# Patient Record
Sex: Male | Born: 1960
Health system: Southern US, Community
[De-identification: ages and names within clinical notes are randomized; demographics above are authoritative.]

## PROBLEM LIST (undated history)

## (undated) DIAGNOSIS — S82209A Unspecified fracture of shaft of unspecified tibia, initial encounter for closed fracture: Secondary | ICD-10-CM

## (undated) DIAGNOSIS — S82409A Unspecified fracture of shaft of unspecified fibula, initial encounter for closed fracture: Secondary | ICD-10-CM

## (undated) HISTORY — PX: TONSILLECTOMY: SUR1361

## (undated) HISTORY — PX: MULTIPLE TOOTH EXTRACTIONS: SHX2053

## (undated) HISTORY — PX: KIDNEY SURGERY: SHX687

## (undated) HISTORY — PX: COLONOSCOPY W/ BIOPSIES AND POLYPECTOMY: SHX1376

---

## 2014-08-01 ENCOUNTER — Inpatient Hospital Stay (HOSPITAL_COMMUNITY): Payer: 59

## 2014-08-01 ENCOUNTER — Emergency Department (HOSPITAL_COMMUNITY): Payer: 59

## 2014-08-01 ENCOUNTER — Encounter (HOSPITAL_COMMUNITY): Payer: Self-pay | Admitting: Emergency Medicine

## 2014-08-01 ENCOUNTER — Inpatient Hospital Stay (HOSPITAL_COMMUNITY)
Admission: EM | Admit: 2014-08-01 | Discharge: 2014-08-05 | DRG: 494 | Disposition: A | Discharge status: Home / Self Care | Payer: 59 | Attending: Orthopaedic Surgery | Admitting: Orthopaedic Surgery

## 2014-08-01 DIAGNOSIS — T1490XA Injury, unspecified, initial encounter: Secondary | ICD-10-CM

## 2014-08-01 DIAGNOSIS — M25772 Osteophyte, left ankle: Secondary | ICD-10-CM | POA: Diagnosis present

## 2014-08-01 DIAGNOSIS — Z8781 Personal history of (healed) traumatic fracture: Secondary | ICD-10-CM | POA: Diagnosis not present

## 2014-08-01 DIAGNOSIS — Z09 Encounter for follow-up examination after completed treatment for conditions other than malignant neoplasm: Secondary | ICD-10-CM

## 2014-08-01 DIAGNOSIS — S82252A Displaced comminuted fracture of shaft of left tibia, initial encounter for closed fracture: Principal | ICD-10-CM | POA: Diagnosis present

## 2014-08-01 DIAGNOSIS — Z87891 Personal history of nicotine dependence: Secondary | ICD-10-CM | POA: Diagnosis not present

## 2014-08-01 DIAGNOSIS — S82452A Displaced comminuted fracture of shaft of left fibula, initial encounter for closed fracture: Secondary | ICD-10-CM | POA: Diagnosis present

## 2014-08-01 DIAGNOSIS — S82202A Unspecified fracture of shaft of left tibia, initial encounter for closed fracture: Secondary | ICD-10-CM

## 2014-08-01 DIAGNOSIS — S82209A Unspecified fracture of shaft of unspecified tibia, initial encounter for closed fracture: Secondary | ICD-10-CM | POA: Diagnosis present

## 2014-08-01 DIAGNOSIS — S82402A Unspecified fracture of shaft of left fibula, initial encounter for closed fracture: Secondary | ICD-10-CM

## 2014-08-01 DIAGNOSIS — M79662 Pain in left lower leg: Secondary | ICD-10-CM | POA: Diagnosis present

## 2014-08-01 DIAGNOSIS — Z886 Allergy status to analgesic agent status: Secondary | ICD-10-CM

## 2014-08-01 DIAGNOSIS — T148XXA Other injury of unspecified body region, initial encounter: Secondary | ICD-10-CM

## 2014-08-01 DIAGNOSIS — S82409A Unspecified fracture of shaft of unspecified fibula, initial encounter for closed fracture: Secondary | ICD-10-CM

## 2014-08-01 MED ORDER — SODIUM CHLORIDE 0.9 % IV SOLN
INTRAVENOUS | Status: AC
  Administered 2014-08-01 – 2014-08-03 (×2): via INTRAVENOUS

## 2014-08-01 MED ORDER — HYDROMORPHONE HCL 1 MG/ML IJ SOLN
0.5000 mg | INTRAMUSCULAR | Status: DC | PRN
  Administered 2014-08-01 – 2014-08-02 (×3): 1 mg via INTRAVENOUS
  Filled 2014-08-01 (×3): qty 1

## 2014-08-01 MED ORDER — FENTANYL CITRATE (PF) 100 MCG/2ML IJ SOLN
INTRAMUSCULAR | Status: AC
  Filled 2014-08-01: qty 2

## 2014-08-01 MED ORDER — ONDANSETRON HCL 4 MG/2ML IJ SOLN: 4.0000 mg | Freq: Three times a day (TID) | INTRAMUSCULAR | Status: AC | PRN

## 2014-08-01 MED ORDER — OXYCODONE-ACETAMINOPHEN 5-325 MG PO TABS
1.0000 | ORAL_TABLET | Freq: Four times a day (QID) | ORAL | Status: DC | PRN
  Administered 2014-08-01: 1 via ORAL
  Filled 2014-08-01: qty 1

## 2014-08-01 MED ORDER — HYDROMORPHONE HCL 1 MG/ML IJ SOLN
1.0000 mg | Freq: Once | INTRAMUSCULAR | Status: AC
  Administered 2014-08-01: 1 mg via INTRAVENOUS
  Filled 2014-08-01: qty 1

## 2014-08-01 MED ORDER — OXYCODONE-ACETAMINOPHEN 5-325 MG PO TABS
1.0000 | ORAL_TABLET | ORAL | Status: DC | PRN
  Administered 2014-08-02 – 2014-08-05 (×10): 2 via ORAL
  Filled 2014-08-01 (×10): qty 2

## 2014-08-01 MED ORDER — FENTANYL CITRATE (PF) 100 MCG/2ML IJ SOLN
100.0000 ug | Freq: Once | INTRAMUSCULAR | Status: AC
  Administered 2014-08-01: 100 ug via INTRAVENOUS

## 2014-08-01 NOTE — ED Notes (Signed)
Report attempted 

## 2014-08-01 NOTE — Progress Notes (Signed)
Patient ID: Corey SahaJeffrey Laidler, male   DOB: [birth year], 54 y.o.   MRN: [id number] Patient with left closed tib fib fracture reduced by dr. Wandra Feinstein. Murphy in ER with family I have cared for in the past .   Will need tibial nail. Compartments soft NVI.  Tibial nail tomorrow at 12 noon. Portable CXR recommended PA and lat due to mediastinum being slightly wide and will get post reduction films on tib fib.   Discussed with patient procedure. Surgery tomorrow at noon. Risks discussed he agrees to proceed.
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
## 2014-08-01 NOTE — ED Notes (Signed)
Pts O2 dropped to 88% on RA. 2L [place] applied. O2 now 100%.

## 2014-08-01 NOTE — ED Notes (Signed)
Pt on the way to [facility] now.
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
## 2014-08-02 ENCOUNTER — Inpatient Hospital Stay (HOSPITAL_COMMUNITY): Payer: 59 | Admitting: Anesthesiology

## 2014-08-02 ENCOUNTER — Encounter (HOSPITAL_COMMUNITY)
Admission: EM | Disposition: A | Discharge status: Home / Self Care | Payer: Self-pay | Source: Home / Self Care | Attending: Orthopaedic Surgery

## 2014-08-02 ENCOUNTER — Inpatient Hospital Stay (HOSPITAL_COMMUNITY): Payer: 59

## 2014-08-02 ENCOUNTER — Encounter (HOSPITAL_COMMUNITY): Payer: Self-pay | Admitting: Anesthesiology

## 2014-08-02 HISTORY — PX: EXTERNAL FIXATION LEG: SHX1549

## 2014-08-02 HISTORY — PX: ORIF FIBULA FRACTURE: SHX5114

## 2014-08-02 LAB — CBC
HCT: 40.2 % (ref 39.0–52.0)
Hemoglobin: 13.6 g/dL (ref 13.0–17.0)
MCH: 30.3 pg (ref 26.0–34.0)
MCHC: 33.8 g/dL (ref 30.0–36.0)
MCV: 89.5 fL (ref 78.0–100.0)
Platelets: 223 10*3/uL (ref 150–400)
RBC: 4.49 MIL/uL (ref 4.22–5.81)
RDW: 13 % (ref 11.5–15.5)
WBC: 10.7 10*3/uL — ABNORMAL HIGH (ref 4.0–10.5)

## 2014-08-02 LAB — CREATININE, SERUM
Creatinine, Ser: 0.9 mg/dL (ref 0.61–1.24)
GFR calc Af Amer: >60 mL/min (ref >60)
GFR calc non Af Amer: >60 mL/min (ref >60)

## 2014-08-02 LAB — SURGICAL PCR SCREEN
MRSA, PCR: NEGATIVE
Staphylococcus aureus: NEGATIVE

## 2014-08-02 SURGERY — OPEN REDUCTION INTERNAL FIXATION (ORIF) FIBULA FRACTURE
Anesthesia: General | Site: Leg Lower | Laterality: Left

## 2014-08-02 MED ORDER — CEFAZOLIN SODIUM-DEXTROSE 2-3 GM-% IV SOLR
INTRAVENOUS | Status: AC
  Filled 2014-08-02: qty 50

## 2014-08-02 MED ORDER — MIDAZOLAM HCL 5 MG/5ML IJ SOLN
INTRAMUSCULAR | Status: DC | PRN
  Administered 2014-08-02: 2 mg via INTRAVENOUS

## 2014-08-02 MED ORDER — DEXAMETHASONE SODIUM PHOSPHATE 4 MG/ML IJ SOLN
INTRAMUSCULAR | Status: DC | PRN
  Administered 2014-08-02: 8 mg via INTRAVENOUS

## 2014-08-02 MED ORDER — MEPERIDINE HCL 25 MG/ML IJ SOLN: 6.2500 mg | INTRAMUSCULAR | Status: DC | PRN

## 2014-08-02 MED ORDER — BISACODYL 10 MG RE SUPP: 10.0000 mg | Freq: Every day | RECTAL | Status: DC | PRN

## 2014-08-02 MED ORDER — NEOSTIGMINE METHYLSULFATE 10 MG/10ML IV SOLN
INTRAVENOUS | Status: DC | PRN
  Administered 2014-08-02: 5 mg via INTRAVENOUS

## 2014-08-02 MED ORDER — BUPIVACAINE-EPINEPHRINE (PF) 0.5% -1:200000 IJ SOLN
INTRAMUSCULAR | Status: AC
  Filled 2014-08-02: qty 30

## 2014-08-02 MED ORDER — LIDOCAINE HCL (CARDIAC) 20 MG/ML IV SOLN
INTRAVENOUS | Status: DC | PRN
  Administered 2014-08-02: 80 mg via INTRAVENOUS

## 2014-08-02 MED ORDER — FLEET ENEMA 7-19 GM/118ML RE ENEM: 1.0000 | ENEMA | Freq: Once | RECTAL | Status: AC | PRN

## 2014-08-02 MED ORDER — CEFAZOLIN SODIUM-DEXTROSE 2-3 GM-% IV SOLR
INTRAVENOUS | Status: DC | PRN
  Administered 2014-08-02: 2 g via INTRAVENOUS

## 2014-08-02 MED ORDER — ACETAMINOPHEN 650 MG RE SUPP: 650.0000 mg | Freq: Four times a day (QID) | RECTAL | Status: DC | PRN

## 2014-08-02 MED ORDER — ONDANSETRON HCL 4 MG/2ML IJ SOLN
INTRAMUSCULAR | Status: DC | PRN
  Administered 2014-08-02: 4 mg via INTRAVENOUS

## 2014-08-02 MED ORDER — ONDANSETRON HCL 4 MG PO TABS: 4.0000 mg | ORAL_TABLET | Freq: Four times a day (QID) | ORAL | Status: DC | PRN

## 2014-08-02 MED ORDER — GLYCOPYRROLATE 0.2 MG/ML IJ SOLN
INTRAMUSCULAR | Status: AC
  Filled 2014-08-02: qty 3

## 2014-08-02 MED ORDER — LACTATED RINGERS IV SOLN
INTRAVENOUS | Status: DC | PRN
  Administered 2014-08-02 (×2): via INTRAVENOUS

## 2014-08-02 MED ORDER — ROCURONIUM BROMIDE 100 MG/10ML IV SOLN
INTRAVENOUS | Status: DC | PRN
  Administered 2014-08-02: 50 mg via INTRAVENOUS

## 2014-08-02 MED ORDER — MIDAZOLAM HCL 2 MG/2ML IJ SOLN
INTRAMUSCULAR | Status: AC
  Filled 2014-08-02: qty 2

## 2014-08-02 MED ORDER — ENOXAPARIN SODIUM 40 MG/0.4ML ~~LOC~~ SOLN
40.0000 mg | SUBCUTANEOUS | Status: DC
  Administered 2014-08-03 – 2014-08-05 (×3): 40 mg via SUBCUTANEOUS
  Filled 2014-08-02 (×3): qty 0.4

## 2014-08-02 MED ORDER — ARTIFICIAL TEARS OP OINT
TOPICAL_OINTMENT | OPHTHALMIC | Status: DC | PRN
  Administered 2014-08-02: 1 via OPHTHALMIC

## 2014-08-02 MED ORDER — FENTANYL CITRATE (PF) 100 MCG/2ML IJ SOLN
INTRAMUSCULAR | Status: DC | PRN
Start: 2014-08-02 — End: 2014-08-02
  Administered 2014-08-02: 50 ug via INTRAVENOUS
  Administered 2014-08-02: 75 ug via INTRAVENOUS
  Administered 2014-08-02: 100 ug via INTRAVENOUS
  Administered 2014-08-02 (×3): 50 ug via INTRAVENOUS
  Administered 2014-08-02: 25 ug via INTRAVENOUS
  Administered 2014-08-02 (×2): 50 ug via INTRAVENOUS

## 2014-08-02 MED ORDER — OXYCODONE HCL 5 MG/5ML PO SOLN
5.0000 mg | Freq: Once | ORAL | Status: DC | PRN
Start: 2014-08-02 — End: 2014-08-02

## 2014-08-02 MED ORDER — POLYETHYLENE GLYCOL 3350 17 G PO PACK
17.0000 g | PACK | Freq: Every day | ORAL | Status: DC | PRN
  Administered 2014-08-05: 17 g via ORAL

## 2014-08-02 MED ORDER — NEOSTIGMINE METHYLSULFATE 10 MG/10ML IV SOLN
INTRAVENOUS | Status: AC
  Filled 2014-08-02: qty 1

## 2014-08-02 MED ORDER — PROPOFOL 10 MG/ML IV BOLUS
INTRAVENOUS | Status: DC | PRN
  Administered 2014-08-02: 300 mg via INTRAVENOUS

## 2014-08-02 MED ORDER — CEFAZOLIN SODIUM-DEXTROSE 2-3 GM-% IV SOLR
2.0000 g | Freq: Four times a day (QID) | INTRAVENOUS | Status: AC
Start: 2014-08-02 — End: 2014-08-03
  Administered 2014-08-02 – 2014-08-03 (×3): 2 g via INTRAVENOUS
  Filled 2014-08-02 (×3): qty 50

## 2014-08-02 MED ORDER — HYDROCODONE-ACETAMINOPHEN 10-325 MG PO TABS
1.0000 | ORAL_TABLET | ORAL | Status: DC | PRN
  Administered 2014-08-02: 1 via ORAL
  Administered 2014-08-02 – 2014-08-04 (×8): 2 via ORAL
  Filled 2014-08-02 (×5): qty 2
  Filled 2014-08-02: qty 1
  Filled 2014-08-02 (×3): qty 2

## 2014-08-02 MED ORDER — FENTANYL CITRATE (PF) 250 MCG/5ML IJ SOLN
INTRAMUSCULAR | Status: AC
  Filled 2014-08-02: qty 5

## 2014-08-02 MED ORDER — METHOCARBAMOL 500 MG PO TABS
500.0000 mg | ORAL_TABLET | Freq: Four times a day (QID) | ORAL | Status: DC | PRN
  Administered 2014-08-02 – 2014-08-05 (×7): 500 mg via ORAL
  Filled 2014-08-02 (×8): qty 1

## 2014-08-02 MED ORDER — ONDANSETRON HCL 4 MG/2ML IJ SOLN: 4.0000 mg | Freq: Four times a day (QID) | INTRAMUSCULAR | Status: DC | PRN

## 2014-08-02 MED ORDER — METHOCARBAMOL 1000 MG/10ML IJ SOLN
500.0000 mg | Freq: Four times a day (QID) | INTRAVENOUS | Status: DC | PRN
  Filled 2014-08-02: qty 5

## 2014-08-02 MED ORDER — KETOROLAC TROMETHAMINE 15 MG/ML IJ SOLN
15.0000 mg | Freq: Four times a day (QID) | INTRAMUSCULAR | Status: AC
  Administered 2014-08-02 – 2014-08-03 (×4): 15 mg via INTRAVENOUS
  Filled 2014-08-02 (×4): qty 1

## 2014-08-02 MED ORDER — DOCUSATE SODIUM 100 MG PO CAPS
100.0000 mg | ORAL_CAPSULE | Freq: Two times a day (BID) | ORAL | Status: DC
  Administered 2014-08-03 – 2014-08-05 (×5): 100 mg via ORAL
  Filled 2014-08-02 (×6): qty 1

## 2014-08-02 MED ORDER — METOCLOPRAMIDE HCL 5 MG/ML IJ SOLN: 5.0000 mg | Freq: Three times a day (TID) | INTRAMUSCULAR | Status: DC | PRN

## 2014-08-02 MED ORDER — BUPIVACAINE-EPINEPHRINE 0.5% -1:200000 IJ SOLN
INTRAMUSCULAR | Status: DC | PRN
  Administered 2014-08-02: 10 mL

## 2014-08-02 MED ORDER — VECURONIUM BROMIDE 10 MG IV SOLR
INTRAVENOUS | Status: DC | PRN
  Administered 2014-08-02: 2 mg via INTRAVENOUS

## 2014-08-02 MED ORDER — OXYCODONE HCL 5 MG PO TABS: 5.0000 mg | ORAL_TABLET | Freq: Once | ORAL | Status: DC | PRN

## 2014-08-02 MED ORDER — GLYCOPYRROLATE 0.2 MG/ML IJ SOLN
INTRAMUSCULAR | Status: DC | PRN
  Administered 2014-08-02: 0.6 mg via INTRAVENOUS

## 2014-08-02 MED ORDER — METOCLOPRAMIDE HCL 5 MG PO TABS: 5.0000 mg | ORAL_TABLET | Freq: Three times a day (TID) | ORAL | Status: DC | PRN

## 2014-08-02 MED ORDER — DEXAMETHASONE SODIUM PHOSPHATE 4 MG/ML IJ SOLN
INTRAMUSCULAR | Status: AC
  Filled 2014-08-02: qty 2

## 2014-08-02 MED ORDER — HYDROMORPHONE HCL 1 MG/ML IJ SOLN
1.0000 mg | INTRAMUSCULAR | Status: DC | PRN
  Administered 2014-08-02 – 2014-08-04 (×9): 1 mg via INTRAVENOUS
  Filled 2014-08-02 (×9): qty 1

## 2014-08-02 MED ORDER — STERILE WATER FOR INJECTION IJ SOLN
INTRAMUSCULAR | Status: AC
  Filled 2014-08-02: qty 10

## 2014-08-02 MED ORDER — ACETAMINOPHEN 325 MG PO TABS: 650.0000 mg | ORAL_TABLET | Freq: Four times a day (QID) | ORAL | Status: DC | PRN

## 2014-08-02 MED ORDER — HYDROMORPHONE HCL 1 MG/ML IJ SOLN: 0.2500 mg | INTRAMUSCULAR | Status: DC | PRN

## 2014-08-02 SURGICAL SUPPLY — 63 items
BANDAGE ELASTIC 4 VELCRO ST LF (GAUZE/BANDAGES/DRESSINGS) ×4 IMPLANT
BANDAGE ELASTIC 6 VELCRO ST LF (GAUZE/BANDAGES/DRESSINGS) ×4 IMPLANT
BANDAGE ESMARK 6X9 LF (GAUZE/BANDAGES/DRESSINGS) IMPLANT
BAR GLASS FIBER EXFX 11X350 (EXFIX) ×8 IMPLANT
BIT DRILL 2.5X2.75 QC CALB (BIT) ×4 IMPLANT
BIT DRILL 2.9 CANN QC NONSTRL (BIT) ×4 IMPLANT
BLADE SURG 15 STRL LF DISP TIS (BLADE) ×2 IMPLANT
BLADE SURG 15 STRL SS (BLADE) ×2
BLADE SURG ROTATE 9660 (MISCELLANEOUS) IMPLANT
BNDG COHESIVE 6X5 TAN STRL LF (GAUZE/BANDAGES/DRESSINGS) ×4 IMPLANT
BNDG ESMARK 6X9 LF (GAUZE/BANDAGES/DRESSINGS)
BNDG GAUZE ELAST 4 BULKY (GAUZE/BANDAGES/DRESSINGS) ×4 IMPLANT
CLAMP BLUE BAR TO PIN (MISCELLANEOUS) ×8 IMPLANT
COVER SURGICAL LIGHT HANDLE (MISCELLANEOUS) ×8 IMPLANT
CUFF TOURNIQUET SINGLE 34IN LL (TOURNIQUET CUFF) IMPLANT
CUFF TOURNIQUET SINGLE 44IN (TOURNIQUET CUFF) IMPLANT
DRAPE C-ARM 42X72 X-RAY (DRAPES) ×4 IMPLANT
DRAPE IMP U-DRAPE 54X76 (DRAPES) ×4 IMPLANT
DRAPE ORTHO SPLIT 77X108 STRL (DRAPES) ×4
DRAPE PROXIMA HALF (DRAPES) ×8 IMPLANT
DRAPE SURG ORHT 6 SPLT 77X108 (DRAPES) ×4 IMPLANT
DRAPE U-SHAPE 47X51 STRL (DRAPES) ×4 IMPLANT
DRSG PAD ABDOMINAL 8X10 ST (GAUZE/BANDAGES/DRESSINGS) ×4 IMPLANT
DURAPREP 26ML APPLICATOR (WOUND CARE) ×4 IMPLANT
ELECT REM PT RETURN 9FT ADLT (ELECTROSURGICAL) ×4
ELECTRODE REM PT RTRN 9FT ADLT (ELECTROSURGICAL) ×2 IMPLANT
FACESHIELD WRAPAROUND (MASK) IMPLANT
GAUZE SPONGE 4X4 12PLY STRL (GAUZE/BANDAGES/DRESSINGS) ×8 IMPLANT
GAUZE XEROFORM 5X9 LF (GAUZE/BANDAGES/DRESSINGS) ×4 IMPLANT
GLOVE BIOGEL PI IND STRL 8 (GLOVE) ×4 IMPLANT
GLOVE BIOGEL PI INDICATOR 8 (GLOVE) ×4
GLOVE ORTHO TXT STRL SZ7.5 (GLOVE) ×4 IMPLANT
GOWN STRL REUS W/ TWL LRG LVL3 (GOWN DISPOSABLE) ×2 IMPLANT
GOWN STRL REUS W/ TWL XL LVL3 (GOWN DISPOSABLE) ×2 IMPLANT
GOWN STRL REUS W/TWL 2XL LVL3 (GOWN DISPOSABLE) ×4 IMPLANT
GOWN STRL REUS W/TWL LRG LVL3 (GOWN DISPOSABLE) ×2
GOWN STRL REUS W/TWL XL LVL3 (GOWN DISPOSABLE) ×2
K-WIRE ACE 1.6X6 (WIRE) ×4
KIT BASIN OR (CUSTOM PROCEDURE TRAY) ×4 IMPLANT
KIT ROOM TURNOVER OR (KITS) ×4 IMPLANT
KWIRE ACE 1.6X6 (WIRE) ×2 IMPLANT
MANIFOLD NEPTUNE II (INSTRUMENTS) ×4 IMPLANT
NS IRRIG 1000ML POUR BTL (IV SOLUTION) ×4 IMPLANT
PACK GENERAL/GYN (CUSTOM PROCEDURE TRAY) ×4 IMPLANT
PACK UNIVERSAL I (CUSTOM PROCEDURE TRAY) ×4 IMPLANT
PAD ARMBOARD 7.5X6 YLW CONV (MISCELLANEOUS) ×8 IMPLANT
PIN CLAMP 2BAR 75MM BLUE (PIN) ×4 IMPLANT
PIN HALF YELLOW 5X160X35 (PIN) ×8 IMPLANT
PIN TRANSFIXING 5.0 (PIN) ×4 IMPLANT
PLATE ACE 100DEG 7HOLE (Plate) ×4 IMPLANT
SCREW ACE CAN 4.0 55M (Screw) ×4 IMPLANT
SCREW CORT 3.5X16 815037016 (Screw) ×28 IMPLANT
SPONGE GAUZE 4X4 12PLY STER LF (GAUZE/BANDAGES/DRESSINGS) ×4 IMPLANT
STAPLER VISISTAT 35W (STAPLE) ×4 IMPLANT
STOCKINETTE IMPERVIOUS LG (DRAPES) ×4 IMPLANT
SUT VIC AB 0 CT1 27 (SUTURE) ×2
SUT VIC AB 0 CT1 27XBRD ANBCTR (SUTURE) ×2 IMPLANT
SUT VIC AB 2-0 CT1 27 (SUTURE) ×2
SUT VIC AB 2-0 CT1 TAPERPNT 27 (SUTURE) ×2 IMPLANT
TOWEL OR 17X24 6PK STRL BLUE (TOWEL DISPOSABLE) ×4 IMPLANT
TOWEL OR 17X26 10 PK STRL BLUE (TOWEL DISPOSABLE) ×4 IMPLANT
TRAY FOLEY CATH 16FRSI W/METER (SET/KITS/TRAYS/PACK) IMPLANT
WATER STERILE IRR 1000ML POUR (IV SOLUTION) ×4 IMPLANT

## 2014-08-02 NOTE — Brief Op Note (Signed)
08/01/2014 - 08/02/2014  2:19 PM  PATIENT:  Corey SahaJeffrey [name]  54 y.o. male  PRE-OPERATIVE DIAGNOSIS:  left tib/ fib fracture  POST-OPERATIVE DIAGNOSIS:  left tib/ fib fracture  PROCEDURE:  Procedure(s): EXTERNAL FIXATION LEFT TIBIA (Left) OPEN REDUCTION INTERNAL FIXATION (ORIF) FIBULA FRACTURE (Left)  SURGEON:  Surgeon(s) and Role:    * Eldred MangesMark C Kymberley Raz, MD - Primary  PHYSICIAN ASSISTANT:   ASSISTANTS: none   ANESTHESIA:   local and general  EBL:  Total I/O In: 1000 [I.V.:1000] Out: -   BLOOD ADMINISTERED:none  DRAINS: none   LOCAL MEDICATIONS USED:  MARCAINE     SPECIMEN:  No Specimen  DISPOSITION OF SPECIMEN:  N/A  COUNTS:  YES  TOURNIQUET:   Total Tourniquet Time Documented: Thigh (Left) - 33 minutes Total: Thigh (Left) - 33 minutes   DICTATION: .Other Dictation: Dictation Number [id number]  PLAN OF CARE: already inpatient  PATIENT DISPOSITION:  PACU - hemodynamically stable.   Delay start of Pharmacological VTE agent (>24hrs) due to surgical blood loss or risk of bleeding: yes

## 2014-08-02 NOTE — Anesthesia Procedure Notes (Signed)
Procedure Name: Intubation Date/Time: 08/02/2014 12:44 PM Performed by: Wray KearnsFOLEY, Corey Rorabaugh A Pre-anesthesia Checklist: Patient identified, Emergency Drugs available, Suction available, Patient being monitored and Timeout performed Patient Re-evaluated:Patient Re-evaluated prior to inductionOxygen Delivery Method: Circle system utilized Preoxygenation: Pre-oxygenation with 100% oxygen Intubation Type: IV induction Ventilation: Mask ventilation without difficulty Laryngoscope Size: Mac and 4 Grade View: Grade I Tube type: Oral Tube size: 7.5 mm Number of attempts: 1 Airway Equipment and Method: Stylet Placement Confirmation: ETT inserted through vocal cords under direct vision,  positive ETCO2 and breath sounds checked- equal and bilateral Secured at: 23 cm Tube secured with: Tape Dental Injury: Teeth and Oropharynx as per pre-operative assessment

## 2014-08-02 NOTE — Interval H&P Note (Signed)
History and Physical Interval Note:  08/02/2014 12:17 PM  [name]  has presented today for surgery, with the diagnosis of left tib/ fib fracture  The various methods of treatment have been discussed with the patient and family. After consideration of risks, benefits and other options for treatment, the patient has consented to  Procedure(s): INTRAMEDULLARY (IM) NAIL TIBIAL (Left) as a surgical intervention .  The patient's history has been reviewed, patient examined, no change in status, stable for surgery.  I have reviewed the patient's chart and labs.  Questions were answered to the patient's satisfaction.     Rilya Longo C
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
## 2014-08-02 NOTE — Brief Op Note (Signed)
08/01/2014 - 08/02/2014  2:17 PM  PATIENT:  Konrad SahaJeffrey Corvera  54 y.o. male  PRE-OPERATIVE DIAGNOSIS:  left tib/ fib fracture  POST-OPERATIVE DIAGNOSIS:  left tib/ fib fracture  PROCEDURE:  Procedure(s): EXTERNAL FIXATION LEFT TIBIA (Left) OPEN REDUCTION INTERNAL FIXATION (ORIF) FIBULA FRACTURE (Left) reduction and fixation of distal tibia plafon fracture  SURGEON:  Surgeon(s) and Role:    * Eldred MangesMark C Tamon Parkerson, MD - Primary  PHYSICIAN ASSISTANT:   ASSISTANTS: none   ANESTHESIA:   local and general  EBL:  Total I/O In: 1000 [I.V.:1000] Out: -   BLOOD ADMINISTERED:none  DRAINS: none   LOCAL MEDICATIONS USED:  MARCAINE     SPECIMEN:  No Specimen  DISPOSITION OF SPECIMEN:  N/A  COUNTS:  YES  TOURNIQUET:   Total Tourniquet Time Documented: Thigh (Left) - 33 minutes Total: Thigh (Left) - 33 minutes   DICTATION: .Other Dictation: Dictation Number [id number]  PLAN OF CARE: already inpatient  PATIENT DISPOSITION:  PACU - hemodynamically stable.   Delay start of Pharmacological VTE agent (>24hrs) due to surgical blood loss or risk of bleeding: yes

## 2014-08-02 NOTE — Transfer of Care (Signed)
Immediate Anesthesia Transfer of Care Note  Patient: Corey SahaJeffrey [name]  Procedure(s) Performed: Procedure(s): EXTERNAL FIXATION LEFT TIBIA (Left) OPEN REDUCTION INTERNAL FIXATION (ORIF) FIBULA FRACTURE (Left)  Patient Location: PACU  Anesthesia Type:General  Level of Consciousness: awake, oriented, sedated, patient cooperative and responds to stimulation  Airway & Oxygen Therapy: Patient Spontanous Breathing and Patient connected to nasal cannula oxygen  Post-op Assessment: Report given to RN, Post -op Vital signs reviewed and stable, Patient moving all extremities and Patient moving all extremities X 4  Post vital signs: Reviewed and stable  Last Vitals:  Filed Vitals:   08/02/14 0509  BP: 134/77  Pulse: 95  Temp: 36.6 C  Resp: 18    Complications: No apparent anesthesia complications

## 2014-08-02 NOTE — Anesthesia Postprocedure Evaluation (Signed)
  Anesthesia Post-op Note  Patient: [name]  Procedure(s) Performed: Procedure(s): EXTERNAL FIXATION LEFT TIBIA (Left) OPEN REDUCTION INTERNAL FIXATION (ORIF) FIBULA FRACTURE (Left)  Patient Location: PACU  Anesthesia Type: General   Level of Consciousness: awake, alert  and oriented  Airway and Oxygen Therapy: Patient Spontanous Breathing  Post-op Pain: none  Post-op Assessment: Post-op Vital signs reviewed  Post-op Vital Signs: Reviewed  Last Vitals:  Filed Vitals:   08/02/14 1515  BP: 151/83  Pulse: 73  Temp:   Resp: 12    Complications: No apparent anesthesia complications
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
## 2014-08-02 NOTE — Anesthesia Preprocedure Evaluation (Signed)
Anesthesia Evaluation  Patient identified by MRN, date of birth, ID band Patient awake    Reviewed: Allergy & Precautions, NPO status , Patient's Chart, lab work & pertinent test results  Airway Mallampati: I  TM Distance: >3 FB Neck ROM: Full    Dental  (+) Teeth Intact, Dental Advisory Given   Pulmonary  breath sounds clear to auscultation        Cardiovascular Rhythm:Regular Rate:Normal     Neuro/Psych    GI/Hepatic   Endo/Other    Renal/GU      Musculoskeletal   Abdominal   Peds  Hematology   Anesthesia Other Findings   Reproductive/Obstetrics                             Anesthesia Physical Anesthesia Plan  ASA: I and emergent  Anesthesia Plan: General   Post-op Pain Management:    Induction: Intravenous  Airway Management Planned: LMA  Additional Equipment:   Intra-op Plan:   Post-operative Plan: Extubation in OR  Informed Consent: I have reviewed the patients History and Physical, chart, labs and discussed the procedure including the risks, benefits and alternatives for the proposed anesthesia with the patient or authorized representative who has indicated his/her understanding and acceptance.   Dental advisory given  Plan Discussed with: CRNA, Anesthesiologist and Surgeon  Anesthesia Plan Comments:         Anesthesia Quick Evaluation

## 2014-08-03 ENCOUNTER — Encounter (HOSPITAL_COMMUNITY): Payer: Self-pay | Admitting: Orthopaedic Surgery
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
## 2014-08-03 NOTE — Progress Notes (Signed)
Dsg change per order.Pins cleaned with1/2  Strength peroxide

## 2014-08-03 NOTE — Progress Notes (Signed)
Subjective: 1 Day Post-Op Procedure(s) (LRB): EXTERNAL FIXATION LEFT TIBIA (Left) OPEN REDUCTION INTERNAL FIXATION (ORIF) FIBULA FRACTURE (Left) Patient reports pain as moderate.    Objective: Vital signs in last 24 hours: Temp:  [97.8 F (36.6 C)-98.9 F (37.2 C)] 98.7 F (37.1 C) (07/11 0414) Pulse Rate:  [65-110] 65 (07/11 0414) Resp:  [12-18] 16 (07/11 0414) BP: (120-151)/(68-92) 125/68 mmHg (07/11 0414) SpO2:  [91 %-98 %] 97 % (07/11 0414)  Intake/Output from previous day: 07/10 0701 - 07/11 0700 In: 1840 [P.O.:240; I.V.:1600] Out: 3475 [Urine:3425; Blood:50] Intake/Output this shift:     Recent Labs  08/02/14 1649  HGB 13.6    Recent Labs  08/02/14 1649  WBC 10.7*  RBC 4.49  HCT 40.2  PLT 223    Recent Labs  08/02/14 1649  CREATININE 0.90   No results for input(s): LABPT, INR in the last 72 hours.  Neurologically intact compartments soft. Some pin tract drainage as expected.   Assessment/Plan: 1 Day Post-Op Procedure(s) (LRB): EXTERNAL FIXATION LEFT TIBIA (Left) OPEN REDUCTION INTERNAL FIXATION (ORIF) FIBULA FRACTURE (Left) Up with therapy  Kalyiah Saintil C 08/03/2014, 10:58 AM

## 2014-08-04 MED ORDER — OXYCODONE-ACETAMINOPHEN 7.5-325 MG PO TABS: 1.0000 | ORAL_TABLET | ORAL | Status: DC | PRN

## 2014-08-04 MED ORDER — METHOCARBAMOL 500 MG PO TABS: 500.0000 mg | ORAL_TABLET | Freq: Four times a day (QID) | ORAL | Status: DC | PRN

## 2014-08-04 NOTE — Progress Notes (Signed)
Utilization review completed. Mana Morison, RN, BSN. 
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
## 2014-08-04 NOTE — Progress Notes (Signed)
CM spoke with patient at bedside about his discharge DME: rolling walker and 3in1 need.  Leitha SchullerCall[facility] [phone] and left voice mail message to supply DME.

## 2014-08-04 NOTE — Progress Notes (Signed)
Patient reports he has insurance coverage with [employer] and his gave his card to the office today.  Patient says his wife will support and supervise him as needed.
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
## 2014-08-04 NOTE — Progress Notes (Signed)
Subjective: 2 Days Post-Op Procedure(s) (LRB): EXTERNAL FIXATION LEFT TIBIA (Left) OPEN REDUCTION INTERNAL FIXATION (ORIF) FIBULA FRACTURE (Left) Patient reports pain as moderate.    Objective: Vital signs in last 24 hours: Temp:  [98.3 F (36.8 C)-98.4 F (36.9 C)] 98.3 F (36.8 C) (07/12 0518) Pulse Rate:  [70-98] 70 (07/12 0518) Resp:  [18] 18 (07/12 0518) BP: (124-138)/(71-76) 133/71 mmHg (07/12 0518) SpO2:  [95 %-98 %] 98 % (07/12 0518)  Intake/Output from previous day: 07/11 0701 - 07/12 0700 In: 2085 [P.O.:360; I.V.:1725] Out: 1050 [Urine:1050] Intake/Output this shift:     Recent Labs  08/02/14 1649  HGB 13.6    Recent Labs  08/02/14 1649  WBC 10.7*  RBC 4.49  HCT 40.2  PLT 223    Recent Labs  08/02/14 1649  CREATININE 0.90   No results for input(s): LABPT, INR in the last 72 hours.  Neurologically intact  Assessment/Plan: 2 Days Post-Op Procedure(s) (LRB): EXTERNAL FIXATION LEFT TIBIA (Left) OPEN REDUCTION INTERNAL FIXATION (ORIF) FIBULA FRACTURE (Left) Up with therapy likely home tomorrow if he is mobile and can do pin tract care.   Jensyn Cambria C 08/04/2014, 7:29 AM

## 2014-08-05 MED ORDER — OXYCODONE-ACETAMINOPHEN 7.5-325 MG PO TABS: 1.0000 | ORAL_TABLET | ORAL | Status: AC | PRN

## 2014-08-05 MED ORDER — METHOCARBAMOL 500 MG PO TABS: 500.0000 mg | ORAL_TABLET | Freq: Four times a day (QID) | ORAL | Status: AC | PRN
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
## 2014-08-05 NOTE — Progress Notes (Signed)
Subjective: 3 Days Post-Op Procedure(s) (LRB): EXTERNAL FIXATION LEFT TIBIA (Left) OPEN REDUCTION INTERNAL FIXATION (ORIF) FIBULA FRACTURE (Left) Patient reports pain as 4 on 0-10 scale.    Objective: Vital signs in last 24 hours: Temp:  [98.6 F (37 C)-99.8 F (37.7 C)] 98.8 F (37.1 C) (07/13 0627) Pulse Rate:  [74-98] 74 (07/13 0627) Resp:  [17-18] 17 (07/13 0627) BP: (133-152)/(88-93) 142/90 mmHg (07/13 0627) SpO2:  [98 %] 98 % (07/13 0627)  Intake/Output from previous day: 07/12 0701 - 07/13 0700 In: 560 [P.O.:560] Out: 2050 [Urine:2050] Intake/Output this shift:     Recent Labs  08/02/14 1649  HGB 13.6    Recent Labs  08/02/14 1649  WBC 10.7*  RBC 4.49  HCT 40.2  PLT 223    Recent Labs  08/02/14 1649  CREATININE 0.90   No results for input(s): LABPT, INR in the last 72 hours.  Neurologically intact  Assessment/Plan: 3 Days Post-Op Procedure(s) (LRB): EXTERNAL FIXATION LEFT TIBIA (Left) OPEN REDUCTION INTERNAL FIXATION (ORIF) FIBULA FRACTURE (Left) Up with therapy  Home this afternoon. Office 2 wks  [name] C 08/05/2014, 7:46 AM

## 2014-08-05 NOTE — Care Management Note (Signed)
Case Management Note  Patient Details  Name: Konrad SahaJeffrey Kavanaugh MRN: [id number] Date of Birth: March 30, 1960  Subjective/Objective:   ORIF  Of left tib/Fib               Action/Plan: Patient has no home health needs. DME has been ordered.    Expected Discharge Date:    08/05/14              Expected Discharge Plan:  Home/Self Care  In-House Referral:  NA  Discharge planning Services  CM Consult  Post Acute Care Choice:  Durable Medical Equipment Choice offered to:  Patient  DME Arranged:  3-N-1, Walker rolling DME Agency:  [facility]  HH Arranged:    HH Agency:     Status of Service:  Completed, signed off  Medicare Important Message Given:    Date Medicare IM Given:    Medicare IM give by:    Date Additional Medicare IM Given:    Additional Medicare Important Message give by:     If discussed at Long Length of Stay Meetings, dates discussed:    Additional Comments:  Durenda GuthrieBrady, Euel Castile Naomi, RN 08/05/2014, 2:35 PM
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
## 2014-08-05 NOTE — Progress Notes (Signed)
Pts dressing around external fixator was changed per order; wife has been taught how to do dressing changes and was present for last two. Discharge instructions and prescriptions given to pt and wife at bedside prior to d/c, all questions answered. Peripheral IV removed, belongings gathered and removed by wife. Pt wheeled out by NT.   [name], Nolan Tuazon G 08/05/2014
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
## 2014-08-25 ENCOUNTER — Other Ambulatory Visit: Payer: Self-pay | Admitting: Orthopaedic Surgery

## 2014-08-25 DIAGNOSIS — G44309 Post-traumatic headache, unspecified, not intractable: Secondary | ICD-10-CM

## 2014-08-26 ENCOUNTER — Ambulatory Visit
Admission: RE | Admit: 2014-08-26 | Discharge: 2014-08-26 | Disposition: A | Discharge status: Home / Self Care | Payer: 59 | Source: Ambulatory Visit | Attending: Orthopaedic Surgery | Admitting: Orthopaedic Surgery

## 2014-08-26 DIAGNOSIS — G44309 Post-traumatic headache, unspecified, not intractable: Secondary | ICD-10-CM

## 2014-09-15 ENCOUNTER — Other Ambulatory Visit (HOSPITAL_COMMUNITY): Payer: Self-pay | Admitting: Orthopaedic Surgery

## 2014-09-18 ENCOUNTER — Encounter (HOSPITAL_COMMUNITY): Payer: Self-pay

## 2014-09-18 ENCOUNTER — Encounter (HOSPITAL_COMMUNITY)
Admission: RE | Admit: 2014-09-18 | Discharge: 2014-09-18 | Disposition: A | Discharge status: Home / Self Care | Payer: 59 | Source: Ambulatory Visit | Attending: Orthopaedic Surgery | Admitting: Orthopaedic Surgery

## 2014-09-18 DIAGNOSIS — Z01812 Encounter for preprocedural laboratory examination: Secondary | ICD-10-CM | POA: Insufficient documentation

## 2014-09-18 DIAGNOSIS — Z01810 Encounter for preprocedural cardiovascular examination: Secondary | ICD-10-CM | POA: Diagnosis not present

## 2014-09-18 HISTORY — DX: Unspecified fracture of shaft of unspecified fibula, initial encounter for closed fracture: S82.209A

## 2014-09-18 HISTORY — DX: Unspecified fracture of shaft of unspecified fibula, initial encounter for closed fracture: S82.409A

## 2014-09-18 LAB — CBC
HEMATOCRIT: 46.3 % (ref 39.0–52.0)
Hemoglobin: 15.6 g/dL (ref 13.0–17.0)
MCH: 30.2 pg (ref 26.0–34.0)
MCHC: 33.7 g/dL (ref 30.0–36.0)
MCV: 89.6 fL (ref 78.0–100.0)
PLATELETS: 373 10*3/uL (ref 150–400)
RBC: 5.17 MIL/uL (ref 4.22–5.81)
RDW: 12.7 % (ref 11.5–15.5)
WBC: 9 10*3/uL (ref 4.0–10.5)

## 2014-09-18 LAB — COMPREHENSIVE METABOLIC PANEL
ALBUMIN: 4.3 g/dL (ref 3.5–5.0)
ALT: 29 U/L (ref 17–63)
AST: 29 U/L (ref 15–41)
Alkaline Phosphatase: 91 U/L (ref 38–126)
Anion gap: 8 (ref 5–15)
BUN: 12 mg/dL (ref 6–20)
CO2: 28 mmol/L (ref 22–32)
Calcium: 9.6 mg/dL (ref 8.9–10.3)
Chloride: 101 mmol/L (ref 101–111)
Creatinine, Ser: 1.08 mg/dL (ref 0.61–1.24)
GFR calc Af Amer: >60 mL/min (ref >60)
GFR calc non Af Amer: >60 mL/min (ref >60)
GLUCOSE: 106 mg/dL — AB (ref 65–99)
POTASSIUM: 4 mmol/L (ref 3.5–5.1)
Sodium: 137 mmol/L (ref 135–145)
Total Bilirubin: 0.5 mg/dL (ref 0.3–1.2)
Total Protein: 7.4 g/dL (ref 6.5–8.1)

## 2014-09-18 LAB — PROTIME-INR
INR: 0.99 (ref 0.00–1.49)
Prothrombin Time: 13.3 seconds (ref 11.6–15.2)

## 2014-09-18 NOTE — Pre-Procedure Instructions (Signed)
    [name]  09/18/2014      [facility] - [name], [place] - [address] [name] [address] Phone: [phone] Fax: [phone]    Your procedure is scheduled on Wednesday, September 23, 2014  Report to [facility] Admitting at 10:30 A.M.  Call this number if you have problems the morning of surgery:  [phone]   Remember:  Do not eat food or drink liquids after midnight Tuesday, September 22, 2014  Take these medicines the morning of surgery with A SIP OF WATER :NONE  Stop taking Aspirin, vitamins and herbal medications. Do not take any NSAIDs ie: Ibuprofen, Advil, Naproxen or any medication containing Aspirin; stop now.   Do not wear jewelry, make-up or nail polish.  Do not wear lotions, powders, or perfumes.  You may wear deodorant.  Do not shave 48 hours prior to surgery.  Men may shave face and neck.  Do not bring valuables to the hospital.  [facility] is not responsible for any belongings or valuables.  Contacts, dentures or bridgework may not be worn into surgery.  Leave your suitcase in the car.  After surgery it may be brought to your room.  For patients admitted to the hospital, discharge time will be determined by your treatment team.  Patients discharged the day of surgery will not be allowed to drive home.   Name and phone number of your driver:    Special instructions: Shower the night before surgery and the morning of surgery with CHG  Please read over the following fact sheets that you were given. Pain Booklet, Coughing and Deep Breathing, MRSA Information and Surgical Site Infection Prevention

## 2014-09-18 NOTE — Progress Notes (Signed)
Spoke with Lauren from surgeon's office, okay for pt to discontinue aspirin.

## 2014-09-18 NOTE — Progress Notes (Signed)
Pt denies SOB, chest pain, and being under the care of a cardiologist. Pt denies having a stress test and cardiac cath but stated that he had an echo 14 years ago. Pt denies having an EKG within the last year.

## 2014-09-22 MED ORDER — CHLORHEXIDINE GLUCONATE 4 % EX LIQD: 60.0000 mL | Freq: Once | CUTANEOUS | Status: DC

## 2014-09-22 MED ORDER — CEFAZOLIN SODIUM-DEXTROSE 2-3 GM-% IV SOLR
2.0000 g | INTRAVENOUS | Status: AC
  Administered 2014-09-23: 2 g via INTRAVENOUS
  Filled 2014-09-22: qty 50

## 2014-09-23 ENCOUNTER — Encounter (HOSPITAL_COMMUNITY): Payer: Self-pay | Admitting: *Deleted

## 2014-09-23 ENCOUNTER — Inpatient Hospital Stay (HOSPITAL_COMMUNITY): Payer: 59

## 2014-09-23 ENCOUNTER — Inpatient Hospital Stay (HOSPITAL_COMMUNITY)
Admission: AD | Admit: 2014-09-23 | Discharge: 2014-09-25 | DRG: 494 | Disposition: A | Discharge status: Home / Self Care | Payer: 59 | Source: Ambulatory Visit | Attending: Orthopaedic Surgery | Admitting: Orthopaedic Surgery

## 2014-09-23 ENCOUNTER — Inpatient Hospital Stay (HOSPITAL_COMMUNITY): Payer: 59 | Admitting: Anesthesiology

## 2014-09-23 ENCOUNTER — Encounter (HOSPITAL_COMMUNITY)
Admission: AD | Disposition: A | Discharge status: Home / Self Care | Payer: Self-pay | Source: Ambulatory Visit | Attending: Orthopaedic Surgery

## 2014-09-23 DIAGNOSIS — Z87891 Personal history of nicotine dependence: Secondary | ICD-10-CM

## 2014-09-23 DIAGNOSIS — S82392G Other fracture of lower end of left tibia, subsequent encounter for closed fracture with delayed healing: Principal | ICD-10-CM

## 2014-09-23 DIAGNOSIS — S82209A Unspecified fracture of shaft of unspecified tibia, initial encounter for closed fracture: Secondary | ICD-10-CM | POA: Diagnosis present

## 2014-09-23 DIAGNOSIS — Z888 Allergy status to other drugs, medicaments and biological substances status: Secondary | ICD-10-CM

## 2014-09-23 DIAGNOSIS — Z419 Encounter for procedure for purposes other than remedying health state, unspecified: Secondary | ICD-10-CM

## 2014-09-23 HISTORY — PX: EXTERNAL FIXATION REMOVAL: SHX5040

## 2014-09-23 HISTORY — PX: TIBIA IM NAIL INSERTION: SHX2516

## 2014-09-23 HISTORY — PX: IM NAILING TIBIA: SUR734

## 2014-09-23 SURGERY — REMOVAL, EXTERNAL FIXATION DEVICE, LOWER EXTREMITY
Anesthesia: General | Site: Leg Lower | Laterality: Left

## 2014-09-23 MED ORDER — ONDANSETRON HCL 4 MG/2ML IJ SOLN: 4.0000 mg | Freq: Four times a day (QID) | INTRAMUSCULAR | Status: DC | PRN

## 2014-09-23 MED ORDER — HYDROMORPHONE HCL 1 MG/ML IJ SOLN
INTRAMUSCULAR | Status: AC
  Administered 2014-09-23: 0.5 mg via INTRAVENOUS
  Filled 2014-09-23: qty 2

## 2014-09-23 MED ORDER — ACETAMINOPHEN 325 MG PO TABS: 650.0000 mg | ORAL_TABLET | Freq: Four times a day (QID) | ORAL | Status: DC | PRN

## 2014-09-23 MED ORDER — ONDANSETRON HCL 4 MG PO TABS: 4.0000 mg | ORAL_TABLET | Freq: Four times a day (QID) | ORAL | Status: DC | PRN

## 2014-09-23 MED ORDER — PROMETHAZINE HCL 25 MG/ML IJ SOLN
INTRAMUSCULAR | Status: AC
  Administered 2014-09-23: 6.25 mg via INTRAVENOUS
  Filled 2014-09-23: qty 1

## 2014-09-23 MED ORDER — MIDAZOLAM HCL 5 MG/5ML IJ SOLN
INTRAMUSCULAR | Status: DC | PRN
  Administered 2014-09-23: 2 mg via INTRAVENOUS

## 2014-09-23 MED ORDER — HYDROMORPHONE HCL 1 MG/ML IJ SOLN
INTRAMUSCULAR | Status: AC
  Filled 2014-09-23: qty 1

## 2014-09-23 MED ORDER — PROMETHAZINE HCL 25 MG/ML IJ SOLN
6.2500 mg | INTRAMUSCULAR | Status: DC | PRN
  Administered 2014-09-23: 6.25 mg via INTRAVENOUS

## 2014-09-23 MED ORDER — OXYCODONE HCL 5 MG PO TABS
ORAL_TABLET | ORAL | Status: AC
  Administered 2014-09-23: 10 mg via ORAL
  Filled 2014-09-23: qty 2

## 2014-09-23 MED ORDER — PROPOFOL 10 MG/ML IV BOLUS
INTRAVENOUS | Status: AC
  Filled 2014-09-23: qty 20

## 2014-09-23 MED ORDER — ACETAMINOPHEN 325 MG PO TABS
ORAL_TABLET | ORAL | Status: DC | PRN
  Administered 2014-09-23: 1500 mg via ORAL

## 2014-09-23 MED ORDER — ACETAMINOPHEN 650 MG RE SUPP: 650.0000 mg | Freq: Four times a day (QID) | RECTAL | Status: DC | PRN

## 2014-09-23 MED ORDER — FENTANYL CITRATE (PF) 250 MCG/5ML IJ SOLN
INTRAMUSCULAR | Status: AC
  Filled 2014-09-23: qty 5

## 2014-09-23 MED ORDER — KETOROLAC TROMETHAMINE 30 MG/ML IJ SOLN
INTRAMUSCULAR | Status: DC | PRN
  Administered 2014-09-23: 30 mg via INTRAVENOUS

## 2014-09-23 MED ORDER — FENTANYL CITRATE (PF) 100 MCG/2ML IJ SOLN
INTRAMUSCULAR | Status: DC | PRN
  Administered 2014-09-23: 250 ug via INTRAVENOUS

## 2014-09-23 MED ORDER — LACTATED RINGERS IV SOLN
Freq: Once | INTRAVENOUS | Status: AC
  Administered 2014-09-23: 11:00:00 via INTRAVENOUS

## 2014-09-23 MED ORDER — ONDANSETRON HCL 4 MG/2ML IJ SOLN
INTRAMUSCULAR | Status: DC | PRN
  Administered 2014-09-23: 8 mg via INTRAVENOUS

## 2014-09-23 MED ORDER — LIDOCAINE HCL (CARDIAC) 20 MG/ML IV SOLN
INTRAVENOUS | Status: DC | PRN
  Administered 2014-09-23: 80 mg via INTRAVENOUS

## 2014-09-23 MED ORDER — CEFAZOLIN SODIUM 1-5 GM-% IV SOLN
1.0000 g | Freq: Three times a day (TID) | INTRAVENOUS | Status: AC
  Administered 2014-09-23 – 2014-09-24 (×3): 1 g via INTRAVENOUS
  Filled 2014-09-23 (×4): qty 50

## 2014-09-23 MED ORDER — METHOCARBAMOL 500 MG PO TABS
500.0000 mg | ORAL_TABLET | Freq: Four times a day (QID) | ORAL | Status: DC | PRN
  Administered 2014-09-23 – 2014-09-24 (×3): 500 mg via ORAL
  Filled 2014-09-23 (×3): qty 1

## 2014-09-23 MED ORDER — HYDROMORPHONE HCL 1 MG/ML IJ SOLN
1.0000 mg | INTRAMUSCULAR | Status: DC | PRN
  Administered 2014-09-23 – 2014-09-24 (×8): 1 mg via INTRAVENOUS
  Filled 2014-09-23 (×8): qty 1

## 2014-09-23 MED ORDER — MIDAZOLAM HCL 2 MG/2ML IJ SOLN
INTRAMUSCULAR | Status: AC
  Filled 2014-09-23: qty 4

## 2014-09-23 MED ORDER — SODIUM CHLORIDE 0.9 % IR SOLN
Status: DC | PRN
  Administered 2014-09-23: 1000 mL

## 2014-09-23 MED ORDER — DEXAMETHASONE SODIUM PHOSPHATE 4 MG/ML IJ SOLN
INTRAMUSCULAR | Status: DC | PRN
  Administered 2014-09-23: 4 mg via INTRAVENOUS

## 2014-09-23 MED ORDER — OXYCODONE HCL 5 MG PO TABS
5.0000 mg | ORAL_TABLET | ORAL | Status: DC | PRN
  Administered 2014-09-23 – 2014-09-24 (×4): 10 mg via ORAL
  Filled 2014-09-23 (×3): qty 2

## 2014-09-23 MED ORDER — LIDOCAINE HCL (CARDIAC) 20 MG/ML IV SOLN: INTRAVENOUS | Status: DC | PRN

## 2014-09-23 MED ORDER — LACTATED RINGERS IV SOLN
INTRAVENOUS | Status: DC | PRN
  Administered 2014-09-23: 12:00:00 via INTRAVENOUS

## 2014-09-23 MED ORDER — METHOCARBAMOL 1000 MG/10ML IJ SOLN
500.0000 mg | Freq: Four times a day (QID) | INTRAVENOUS | Status: DC | PRN
  Filled 2014-09-23: qty 5

## 2014-09-23 MED ORDER — POTASSIUM CHLORIDE IN NACL 20-0.45 MEQ/L-% IV SOLN
INTRAVENOUS | Status: DC
  Filled 2014-09-23 (×6): qty 1000

## 2014-09-23 MED ORDER — ACETAMINOPHEN 500 MG PO TABS
ORAL_TABLET | ORAL | Status: AC
  Filled 2014-09-23: qty 3

## 2014-09-23 MED ORDER — HYDROMORPHONE HCL 1 MG/ML IJ SOLN
0.2500 mg | INTRAMUSCULAR | Status: DC | PRN
  Administered 2014-09-23 (×6): 0.5 mg via INTRAVENOUS

## 2014-09-23 MED ORDER — METOCLOPRAMIDE HCL 5 MG/ML IJ SOLN: 5.0000 mg | Freq: Three times a day (TID) | INTRAMUSCULAR | Status: DC | PRN

## 2014-09-23 MED ORDER — PROPOFOL 10 MG/ML IV BOLUS: INTRAVENOUS | Status: DC | PRN

## 2014-09-23 MED ORDER — METOCLOPRAMIDE HCL 5 MG PO TABS: 5.0000 mg | ORAL_TABLET | Freq: Three times a day (TID) | ORAL | Status: DC | PRN

## 2014-09-23 MED ORDER — METHOCARBAMOL 500 MG PO TABS
ORAL_TABLET | ORAL | Status: AC
  Administered 2014-09-23: 500 mg via ORAL
  Filled 2014-09-23: qty 1

## 2014-09-23 MED ORDER — HYDROMORPHONE HCL 1 MG/ML IJ SOLN
INTRAMUSCULAR | Status: DC | PRN
  Administered 2014-09-23 (×5): .2 mg via INTRAVENOUS

## 2014-09-23 MED ORDER — PROPOFOL 10 MG/ML IV BOLUS
INTRAVENOUS | Status: DC | PRN
  Administered 2014-09-23: 180 mg via INTRAVENOUS
  Administered 2014-09-23: 50 mg via INTRAVENOUS

## 2014-09-23 MED ORDER — HYDROMORPHONE HCL 1 MG/ML IJ SOLN
INTRAMUSCULAR | Status: AC
  Administered 2014-09-23: 0.5 mg via INTRAVENOUS
  Filled 2014-09-23: qty 1

## 2014-09-23 SURGICAL SUPPLY — 73 items
BANDAGE ELASTIC 4 VELCRO ST LF (GAUZE/BANDAGES/DRESSINGS) ×3 IMPLANT
BANDAGE ELASTIC 6 VELCRO ST LF (GAUZE/BANDAGES/DRESSINGS) IMPLANT
BANDAGE ESMARK 6X9 LF (GAUZE/BANDAGES/DRESSINGS) ×1 IMPLANT
BIT DRILL 3.8X6 NS (BIT) ×3 IMPLANT
BIT DRILL 4.4 NS (BIT) ×3 IMPLANT
BLADE SURG 15 STRL LF DISP TIS (BLADE) ×1 IMPLANT
BLADE SURG 15 STRL SS (BLADE) ×2
BLADE SURG ROTATE 9660 (MISCELLANEOUS) IMPLANT
BNDG COHESIVE 6X5 TAN STRL LF (GAUZE/BANDAGES/DRESSINGS) ×3 IMPLANT
BNDG ESMARK 6X9 LF (GAUZE/BANDAGES/DRESSINGS) ×3
BNDG GAUZE ELAST 4 BULKY (GAUZE/BANDAGES/DRESSINGS) IMPLANT
COVER SURGICAL LIGHT HANDLE (MISCELLANEOUS) ×3 IMPLANT
CUFF TOURNIQUET SINGLE 34IN LL (TOURNIQUET CUFF) ×3 IMPLANT
CUFF TOURNIQUET SINGLE 44IN (TOURNIQUET CUFF) IMPLANT
DRAPE C-ARM 42X72 X-RAY (DRAPES) ×3 IMPLANT
DRAPE IMP U-DRAPE 54X76 (DRAPES) IMPLANT
DRAPE ORTHO SPLIT 77X108 STRL (DRAPES) ×4
DRAPE PROXIMA HALF (DRAPES) ×3 IMPLANT
DRAPE SURG ORHT 6 SPLT 77X108 (DRAPES) ×2 IMPLANT
DRAPE U-SHAPE 47X51 STRL (DRAPES) ×3 IMPLANT
DRSG ADAPTIC 3X8 NADH LF (GAUZE/BANDAGES/DRESSINGS) IMPLANT
DRSG MEPILEX BORDER 4X4 (GAUZE/BANDAGES/DRESSINGS) ×9 IMPLANT
DRSG PAD ABDOMINAL 8X10 ST (GAUZE/BANDAGES/DRESSINGS) ×3 IMPLANT
DURAPREP 26ML APPLICATOR (WOUND CARE) ×3 IMPLANT
ELECT REM PT RETURN 9FT ADLT (ELECTROSURGICAL) ×3
ELECTRODE REM PT RTRN 9FT ADLT (ELECTROSURGICAL) ×1 IMPLANT
FACESHIELD WRAPAROUND (MASK) IMPLANT
GAUZE SPONGE 4X4 12PLY STRL (GAUZE/BANDAGES/DRESSINGS) ×3 IMPLANT
GAUZE XEROFORM 5X9 LF (GAUZE/BANDAGES/DRESSINGS) ×3 IMPLANT
GLOVE BIOGEL PI IND STRL 8 (GLOVE) ×2 IMPLANT
GLOVE BIOGEL PI INDICATOR 8 (GLOVE) ×4
GLOVE ORTHO TXT STRL SZ7.5 (GLOVE) ×6 IMPLANT
GOWN STRL REUS W/ TWL LRG LVL3 (GOWN DISPOSABLE) ×1 IMPLANT
GOWN STRL REUS W/ TWL XL LVL3 (GOWN DISPOSABLE) ×1 IMPLANT
GOWN STRL REUS W/TWL 2XL LVL3 (GOWN DISPOSABLE) ×3 IMPLANT
GOWN STRL REUS W/TWL LRG LVL3 (GOWN DISPOSABLE) ×2
GOWN STRL REUS W/TWL XL LVL3 (GOWN DISPOSABLE) ×2
GUIDEPIN 3.2X17.5 THRD DISP (PIN) ×3 IMPLANT
GUIDEWIRE BALL NOSE 80CM (WIRE) ×3 IMPLANT
KIT BASIN OR (CUSTOM PROCEDURE TRAY) ×3 IMPLANT
KIT ROOM TURNOVER OR (KITS) ×3 IMPLANT
MANIFOLD NEPTUNE II (INSTRUMENTS) ×3 IMPLANT
NAIL TIBIAL 9MMX33CM (Nail) ×3 IMPLANT
NS IRRIG 1000ML POUR BTL (IV SOLUTION) ×3 IMPLANT
PACK GENERAL/GYN (CUSTOM PROCEDURE TRAY) IMPLANT
PACK ORTHO EXTREMITY (CUSTOM PROCEDURE TRAY) ×3 IMPLANT
PACK UNIVERSAL I (CUSTOM PROCEDURE TRAY) IMPLANT
PAD ARMBOARD 7.5X6 YLW CONV (MISCELLANEOUS) ×6 IMPLANT
PAD CAST 4YDX4 CTTN HI CHSV (CAST SUPPLIES) ×1 IMPLANT
PADDING CAST COTTON 4X4 STRL (CAST SUPPLIES) ×2
PADDING CAST COTTON 6X4 STRL (CAST SUPPLIES) IMPLANT
PENCIL BUTTON HOLSTER BLD 10FT (ELECTRODE) IMPLANT
SCREW ACECAP 38MM (Screw) ×3 IMPLANT
SCREW ACECAP 40MM (Screw) ×3 IMPLANT
SCREW ACECAP 42MM (Screw) ×3 IMPLANT
SCREW PROXIMAL DEPUY (Screw) ×4 IMPLANT
SCREW PRXML FT 50X5.5XLCK NS (Screw) ×1 IMPLANT
SCREW PRXML FT 55X5.5XNS TIB (Screw) ×1 IMPLANT
SCREWDRIVER HEX TIP 3.5MM (MISCELLANEOUS) ×3 IMPLANT
SPONGE LAP 18X18 X RAY DECT (DISPOSABLE) ×3 IMPLANT
SPONGE SCRUB IODOPHOR (GAUZE/BANDAGES/DRESSINGS) IMPLANT
STAPLER VISISTAT 35W (STAPLE) ×3 IMPLANT
STOCKINETTE IMPERVIOUS LG (DRAPES) ×3 IMPLANT
SUT ETHILON 3 0 PS 1 (SUTURE) IMPLANT
SUT VIC AB 0 CT1 27 (SUTURE) ×2
SUT VIC AB 0 CT1 27XBRD ANBCTR (SUTURE) ×1 IMPLANT
SUT VIC AB 2-0 CT1 27 (SUTURE) ×2
SUT VIC AB 2-0 CT1 TAPERPNT 27 (SUTURE) ×1 IMPLANT
TOWEL OR 17X24 6PK STRL BLUE (TOWEL DISPOSABLE) ×6 IMPLANT
TOWEL OR 17X26 10 PK STRL BLUE (TOWEL DISPOSABLE) ×3 IMPLANT
TRAY FOLEY CATH 16FRSI W/METER (SET/KITS/TRAYS/PACK) IMPLANT
UNDERPAD 30X30 INCONTINENT (UNDERPADS AND DIAPERS) IMPLANT
WATER STERILE IRR 1000ML POUR (IV SOLUTION) IMPLANT

## 2014-09-23 NOTE — Anesthesia Postprocedure Evaluation (Signed)
  Anesthesia Post-op Note  Patient: [name]  Procedure(s) Performed: Procedure(s) (LRB): REMOVAL EXTERNAL FIXATION LEFT TIBIA/FIBULA (Left) INTRAMEDULLARY (IM) NAIL TIBIAL LEFT (Left)  Patient Location: PACU  Anesthesia Type: General  Level of Consciousness: awake and alert   Airway and Oxygen Therapy: Patient Spontanous Breathing  Post-op Pain: mild  Post-op Assessment: Post-op Vital signs reviewed, Patient's Cardiovascular Status Stable, Respiratory Function Stable, Patent Airway and No signs of Nausea or vomiting  Last Vitals:  Filed Vitals:   09/23/14 1500  BP: 140/78  Pulse: 95  Temp:   Resp:     Post-op Vital Signs: stable   Complications: No apparent anesthesia complications

## 2014-09-23 NOTE — Progress Notes (Signed)
Orthopedic Tech Progress Note Patient Details:  [name] 02/15/1960 [id number]  Ortho Devices Type of Ortho Device: Knee Immobilizer Ortho Device/Splint Location: LLE Ortho Device/Splint Interventions: Casandra Doffing 09/23/2014, 4:38 PM

## 2014-09-23 NOTE — Op Note (Signed)
+   Test test test test  Preop diagnosis: Left tib-fib fracture with external fixator.    postop diagnosis: Same  Procedure: Removal of tibiocalcaneal external fixator left lower extremity, left Biomet tibial nail with proximal and distal interlock  Surgeon: Annell Greening M.D.  Assistant: Zonia Kief PA-C medically necessary and present for the entire procedure  Anesthesia: Gen.  Tourniquet time 40 minutes  Implants Biomet 3209 mm nail with 2 proximal and 2 distal interlock screws  Procedure: After induction general anesthesia timeout procedure proximal thigh tourniquet application external fixator was loosened with the T-handle drill was used to remove the bicortical half pins anteriorly in the tibia. After Betadine prepping the pin was cut even with the skin with a large bolt cutter and then was removed with the drill laterally removing the pin out of the calcaneus. Next everything was scrubbed DuraPrep was used from the tip the toes to the tourniquet usual extremity sheets drapes split sheets impervious stockinette was applied timeout procedure again done. Sterile skin marker was used for incision adjacent to the medial edge of the patellar tendon leg was wrapped an Esmarch tourniquet was inflated. Incision was made box will tibial flare was noted the joint was not entered. Steinmann pin was drilled checked under C-arm AP and lateral overreamed with the canal starter reamer and then the beaded tip guide rod was placed was placed across the fracture site down to the ankle just above the previous screw that was used to reduce the Healon fracture. Purulence fracture was healing nicely. There was slight motion to the fracture site which had the multiple butterfly fragments. Reaming up to 10.5 followed by measurement insertion of the 9 mm ride. Using the the proximal died oblique screw was placed from anterolateral to posterior medial and then 1 from lateral to medial bicortical checked under C-arm. Free  and technique were used for 2 distal interlock screws 1 anterior posterior the other medial lateral bicortical good purchase good alignment leg was out to length good position. Fibular plate was still intact holding the fibula out to length. All layers were irrigated tourniquet deflated standard layer closure reapproximating the tendon was 0 bicortical 209 in the subtendinous tissue staple closure. Pin track care be done on the pin sites small dressing 4 x 4's and the taper applied the other screw sites and small incision at the patellar tendon. Patient tolerated the procedure well transferred recovery room in stable condition.

## 2014-09-23 NOTE — Transfer of Care (Signed)
Immediate Anesthesia Transfer of Care Note  Patient: [name]  Procedure(s) Performed: Procedure(s): REMOVAL EXTERNAL FIXATION LEFT TIBIA/FIBULA (Left) INTRAMEDULLARY (IM) NAIL TIBIAL LEFT (Left)  Patient Location: PACU  Anesthesia Type:General  Level of Consciousness: awake, alert , oriented and patient cooperative  Airway & Oxygen Therapy: Patient Spontanous Breathing and Patient connected to nasal cannula oxygen  Post-op Assessment: Report given to RN, Post -op Vital signs reviewed and unstable, Anesthesiologist notified and Patient moving all extremities  Post vital signs: Reviewed and stable  Last Vitals:  Filed Vitals:   09/23/14 1411  BP:   Pulse:   Temp: 36.4 C  Resp:     Complications: No apparent anesthesia complications

## 2014-09-23 NOTE — Brief Op Note (Signed)
09/23/2014  2:23 PM  PATIENT:  [name]  54 y.o. male  PRE-OPERATIVE DIAGNOSIS:  Left Tibia/Fibula Fracture, Retained External Fixation  POST-OPERATIVE DIAGNOSIS:  Left Tibia/Fibula Fracture, Retained External Fixation  PROCEDURE:  Procedure(s): REMOVAL EXTERNAL FIXATION LEFT TIBIA/FIBULA (Left) INTRAMEDULLARY (IM) NAIL TIBIAL LEFT (Left)  SURGEON:  Surgeon(s) and Role:    * Eldred Manges, MD - Primary  PHYSICIAN ASSISTANT: james m. Barry Dienes     ANESTHESIA:   general  EBL:  Total I/O In: 550 [I.V.:550] Out: 50 [Blood:50]  BLOOD ADMINISTERED:none  DRAINS: none   LOCAL MEDICATIONS USED:  NONE  SPECIMEN:  No Specimen  DISPOSITION OF SPECIMEN:  N/A  COUNTS:  YES  TOURNIQUET:   Total Tourniquet Time Documented: Thigh (Left) - 44 minutes Total: Thigh (Left) - 44 minutes     PATIENT DISPOSITION:  PACU - hemodynamically stable.

## 2014-09-23 NOTE — Evaluation (Signed)
Physical Therapy Evaluation Patient Details Name: [name] MRN: [id number] DOB: 1960-02-05 Today's Date: 09/23/2014   History of Present Illness  54 y.o. male s/p removal of external fixator LLE tibia/fibula, and insertion of left tibial intramedullary nail.  Clinical Impression  Patient is s/p above procedure presenting with functional limitations due to the deficits listed below (see PT Problem List). [name] is easily agitated/annoyed today when offered education and recommendations for gait and transfer training. He has been managing well on his own at home. Reviewed weight-bearing precautions, elevated positioning, and use of knee immobilizer including application/removal. Ambulates without overt loss of balance using crutches. Pt denies any changes in functional ability post-op. Adequate for d/c for PT standpoint when medically ready.        Follow Up Recommendations Outpatient PT (when cleared by MD) - For LLE strength/ROM and maintenance     Equipment Recommendations  None recommended by PT    Recommendations for Other Services       Precautions / Restrictions Precautions Precautions: Fall Required Braces or Orthoses: Knee Immobilizer - Left Knee Immobilizer - Left: On when out of bed or walking Restrictions Weight Bearing Restrictions: Yes LLE Weight Bearing: Partial weight bearing LLE Partial Weight Bearing Percentage or Pounds:  (Not specified)      Mobility  Bed Mobility Overal bed mobility: Modified Independent             General bed mobility comments: Extra time  Transfers Overall transfer level: Needs assistance Equipment used: Crutches Transfers: Sit to/from Stand Sit to Stand: Supervision         General transfer comment: Supervision for safety. Minimal sway noted. Able to self correct and appropriately use crutches.  Ambulation/Gait Ambulation/Gait assistance: Supervision Ambulation Distance (Feet): 80 Feet Assistive device:  Crutches Gait Pattern/deviations:  (2 point gait) Gait velocity: decreased Gait velocity interpretation: Below normal speed for age/gender General Gait Details: Declines to place toe on ground for proprioceptive feedback. Crutches widely abducted but states he will do this his way.  No overt loss of balance, mild sway with turns but self corrects.  Stairs            Wheelchair Mobility    Modified Rankin (Stroke Patients Only)       Balance Overall balance assessment: Needs assistance Sitting-balance support: No upper extremity supported;Feet supported Sitting balance-Leahy Scale: Good     Standing balance support: Single extremity supported Standing balance-Leahy Scale: Poor                               Pertinent Vitals/Pain Pain Assessment: Faces Faces Pain Scale: Hurts little more Pain Location: LLE Pain Intervention(s): Monitored during session;Repositioned    Home Living Family/patient expects to be discharged to:: Private residence Living Arrangements: Spouse/significant other;Children Available Help at Discharge: Family;Available PRN/intermittently Type of Home: House Home Access: Stairs to enter Entrance Stairs-Rails: Can reach both Entrance Stairs-Number of Steps: 4 Home Layout: Two level;Able to live on main level with bedroom/bathroom Home Equipment: Crutches;Other (comment) (3-in-1) Additional Comments: Most information pulled from previous note as patient was agitated during questioning.    Prior Function Level of Independence: Independent with assistive device(s)         Comments: Using crutches, avoiding stairs in house     Hand Dominance   Dominant Hand: Right    Extremity/Trunk Assessment   Upper Extremity Assessment: Defer to OT evaluation  Lower Extremity Assessment: LLE deficits/detail   LLE Deficits / Details: decreased strength and ROM as expected post op. Reports numbness across dorsum of foot which  he reports has been present since initial injury. Able to volitionally move toes and ankle on LLE.     Communication   Communication: No difficulties  Cognition Arousal/Alertness: Awake/alert Behavior During Therapy: Agitated Overall Cognitive Status: Within Functional Limits for tasks assessed                      General Comments General comments (skin integrity, edema, etc.): Reviewed precautions and use of knee immobilizer. Pt easily agitated.    Exercises        Assessment/Plan    PT Assessment Patient needs continued PT services  PT Diagnosis Difficulty walking;Abnormality of gait;Acute pain   PT Problem List Decreased strength;Decreased range of motion;Decreased activity tolerance;Decreased balance;Decreased mobility;Decreased knowledge of use of DME;Pain;Impaired sensation  PT Treatment Interventions DME instruction;Gait training;Stair training;Functional mobility training;Therapeutic exercise;Therapeutic activities;Balance training;Neuromuscular re-education;Patient/family education;Modalities   PT Goals (Current goals can be found in the Care Plan section) Acute Rehab PT Goals Patient Stated Goal: none stated PT Goal Formulation: With patient Time For Goal Achievement: 10/07/14 Potential to Achieve Goals: Good    Frequency Min 5X/week   Barriers to discharge Decreased caregiver support wife works    Co-evaluation               End of Session Equipment Utilized During Treatment: Left knee immobilizer Activity Tolerance: Patient tolerated treatment well Patient left: in bed;with call bell/phone within reach;with family/visitor present Nurse Communication: Mobility status         Time: 1610-9604 PT Time Calculation (min) (ACUTE ONLY): 17 min   Charges:   PT Evaluation $Initial PT Evaluation Tier I: 1 Procedure     PT G CodesBerton Mount 09/23/2014, 6:54 PM Sunday [address] [place], [place] [phone]

## 2014-09-23 NOTE — Anesthesia Procedure Notes (Signed)
Procedure Name: Intubation Performed by: Renae Fickle Pre-anesthesia Checklist: Patient identified, Emergency Drugs available, Suction available and Patient being monitored Patient Re-evaluated:Patient Re-evaluated prior to inductionOxygen Delivery Method: Circle system utilized Preoxygenation: Pre-oxygenation with 100% oxygen Intubation Type: IV induction Ventilation: Mask ventilation without difficulty Laryngoscope Size: Mac and 4 Grade View: Grade II Tube type: Oral Tube size: 7.5 mm Number of attempts: 1 Secured at: 22.5 cm Tube secured with: Tape Dental Injury: Teeth and Oropharynx as per pre-operative assessment

## 2014-09-23 NOTE — Anesthesia Preprocedure Evaluation (Signed)
Anesthesia Evaluation  Patient identified by MRN, date of birth, ID band Patient awake    Reviewed: Allergy & Precautions, NPO status , Patient's Chart, lab work & pertinent test results  Airway Mallampati: II  TM Distance: >3 FB Neck ROM: Full    Dental no notable dental hx.    Pulmonary neg pulmonary ROS, former smoker,  breath sounds clear to auscultation  Pulmonary exam normal       Cardiovascular negative cardio ROS Normal cardiovascular examRhythm:Regular Rate:Normal     Neuro/Psych negative neurological ROS  negative psych ROS   GI/Hepatic negative GI ROS, Neg liver ROS,   Endo/Other  negative endocrine ROS  Renal/GU negative Renal ROS  negative genitourinary   Musculoskeletal negative musculoskeletal ROS (+)   Abdominal   Peds negative pediatric ROS (+)  Hematology negative hematology ROS (+)   Anesthesia Other Findings   Reproductive/Obstetrics negative OB ROS                             Anesthesia Physical Anesthesia Plan  ASA: I  Anesthesia Plan: General   Post-op Pain Management:    Induction: Intravenous  Airway Management Planned: Oral ETT  Additional Equipment:   Intra-op Plan:   Post-operative Plan: Extubation in OR  Informed Consent: I have reviewed the patients History and Physical, chart, labs and discussed the procedure including the risks, benefits and alternatives for the proposed anesthesia with the patient or authorized representative who has indicated his/her understanding and acceptance.   Dental advisory given  Plan Discussed with: CRNA and Surgeon  Anesthesia Plan Comments:         Anesthesia Quick Evaluation

## 2014-09-23 NOTE — H&P (Signed)
[name] is an 54 y.o. male.    The patient returns for followup.  He had some drainage, which is resolved with the doxycycline.  The patient had a complex, comminuted, intra-articular fracture as well as multiple butterfly fragments in the distal third of the tibia. He has an expanding ex-fix across the ankle and now is ready for re-admission for external fixator removal and tibial interlocking nail.  Fibular plate is in satisfactory position.  He has not healed the fibula at this point.  Joint remains well reduced with the plafon screw that was placed at the ankle joint.   MEDICATIONS:  Not on any current medications.   ALLERGIES:  NO KNOWN DRUG ALLERGIES.  He had a reaction to VOLTAREN.  It gave him a rash.   PAST SURGICAL HISTORY:  Previous knee surgeries in 2003 and 2004, both right and left.  Kidney surgery in 1972.    FAMILY HISTORY:  Positive for mesothelioma of the lung, which was his dad.   SOCIAL HISTORY:  Married to his wife, Clarene Critchley.  He is self-employed as a Music therapist.  He does not smoke.  Drinks a few times a week.   REVIEW OF SYSTEMS:  A 14-point review of systems is positive for knee arthritis.   Past Medical History  Diagnosis Date  . Tibia/fibula fracture     left    Past Surgical History  Procedure Laterality Date  . External fixation leg Left 08/02/2014    Procedure: EXTERNAL FIXATION LEFT TIBIA;  Surgeon: Eldred Manges, MD;  Location: [facility] OR;  Service: Orthopedics;  Laterality: Left;  . Orif fibula fracture Left 08/02/2014    Procedure: OPEN REDUCTION INTERNAL FIXATION (ORIF) FIBULA FRACTURE;  Surgeon: Eldred Manges, MD;  Location: [facility] OR;  Service: Orthopedics;  Laterality: Left;  . Kidney surgery      at age 30  . Tonsillectomy    . Colonoscopy w/ biopsies and polypectomy    . Multiple tooth extractions      Family History  Problem Relation Age of Onset  . Cancer Father   . Heart attack Father    Social History:  reports that he has quit smoking. His smoking  use included Cigarettes. He does not have any smokeless tobacco history on file. He reports that he does not drink alcohol or use illicit drugs.  Allergies:  Allergies  Allergen Reactions  . Voltaren [Diclofenac Sodium] Itching and Rash    No prescriptions prior to admission    No results found for this or any previous visit (from the past 48 hour(s)). No results found.  Review of Systems  Constitutional: Negative.   HENT: Negative.   Respiratory: Negative.   Cardiovascular: Negative.   Gastrointestinal: Negative.   Genitourinary: Negative.   Musculoskeletal: Positive for joint pain.  Skin: Negative.   Psychiatric/Behavioral: Negative.     There were no vitals taken for this visit. Physical Exam  Constitutional: He is oriented to person, place, and time. He appears well-developed.  HENT:  Head: Normocephalic.  Eyes: Pupils are equal, round, and reactive to light.  Neck: Normal range of motion.  Respiratory: No respiratory distress.  GI: He exhibits no distension.  Musculoskeletal: He exhibits tenderness.  Neurological: He is alert and oriented to person, place, and time.  Skin: Skin is warm and dry.  Psychiatric: He has a normal mood and affect.    :  Height 5 feet 9 inches.  Weight 190 pounds.  Alert and oriented.  Good visual acuity.  No lymphadenopathy.  No rash over exposed skin.  Lungs are clear.  Heart:  Regular rate and rhythm.  Abdomen is nontender.  Liver and spleen are not palpably enlarged.  Good range of motion of his hips.  His knees reach full extension.  Pin tracts look good.  There is mild swelling of the left lower extremity as expected from the comminuted fracture.  The incision laterally is completely healed.   RADIOGRAPHS:  X-rays from last week demonstrate alignment is still satisfactory.     ASSESSMENT:  Tib-fib fracture with retained external fixator, now ready for external fixator removal and tibial nail with proximal distal interlock.   PLAN:  We  discussed with the patient pin tract problems.  He has done well with the doxycycline.  I would recommend proceeding with tibial nail placement and removal of the external fixator.  Usually he would require a 2-night stay in the hospital for pain medication.  Risks of surgery discussed.  He understands there is some risk of infection, re-operation, loss of fixation, angulation, and possibility that he might later have to have an exchange nailing for a larger nail if healing was not progressing.  He understands that he has a severely comminuted tibial fracture with multiple fragments.  His alignment looks good and he understands that the tibial nail is the most successful way to reach a good outcome.  Removal of external fixator and tibial nailing will allow him to start doing some gentle ankle range of motion to prevent further ankle stiffness and still maintain length and rotation.  All questions answered.  He understands and requests to proceed.  OWENS,JAMES M 09/23/2014, 9:38 AM

## 2014-09-23 NOTE — Interval H&P Note (Signed)
History and Physical Interval Note:  09/23/2014 12:28 PM  [name]  has presented today for surgery, with the diagnosis of Left Tibia/Fibula Fracture, Retained External Fixation  The various methods of treatment have been discussed with the patient and family. After consideration of risks, benefits and other options for treatment, the patient has consented to  Procedure(s): REMOVAL EXTERNAL FIXATION LEFT TIBIA/FIBULA (Left) INTRAMEDULLARY (IM) NAIL TIBIAL LEFT (Left) as a surgical intervention .  The patient's history has been reviewed, patient examined, no change in status, stable for surgery.  I have reviewed the patient's chart and labs.  Questions were answered to the patient's satisfaction.     [name]

## 2014-09-24 ENCOUNTER — Encounter (HOSPITAL_COMMUNITY): Payer: Self-pay | Admitting: General Practice

## 2014-09-24 MED ORDER — OXYCODONE-ACETAMINOPHEN 5-325 MG PO TABS
1.0000 | ORAL_TABLET | ORAL | Status: DC | PRN
  Administered 2014-09-24 – 2014-09-25 (×8): 1 via ORAL
  Filled 2014-09-24 (×8): qty 1

## 2014-09-24 MED ORDER — OXYCODONE HCL 5 MG PO TABS
5.0000 mg | ORAL_TABLET | ORAL | Status: DC | PRN
  Administered 2014-09-24 – 2014-09-25 (×8): 5 mg via ORAL
  Filled 2014-09-24 (×8): qty 1

## 2014-09-24 MED ORDER — METHOCARBAMOL 750 MG PO TABS
750.0000 mg | ORAL_TABLET | Freq: Four times a day (QID) | ORAL | Status: DC | PRN
  Administered 2014-09-24 – 2014-09-25 (×3): 750 mg via ORAL
  Filled 2014-09-24 (×3): qty 1

## 2014-09-24 MED ORDER — METHOCARBAMOL 1000 MG/10ML IJ SOLN
500.0000 mg | Freq: Four times a day (QID) | INTRAVENOUS | Status: DC | PRN
  Filled 2014-09-24: qty 5

## 2014-09-24 NOTE — Progress Notes (Signed)
Utilization review completed. Jeanmarc Viernes, RN, BSN. 

## 2014-09-24 NOTE — Progress Notes (Signed)
Physical Therapy Treatment Patient Details Name: [name] MRN: [id number] DOB: [birth year] Today's Date: 09/24/2014    History of Present Illness 54 y.o. male s/p removal of external fixator LLE tibia/fibula, and insertion of left tibial intramedullary nail.    PT Comments    [name] remains agitated this session, mainly regarding use of KI and it's inconvenience when ambulating and during stair training.  Pt is impulsive but educated on safe technique w/ all mobility.  He is appropriate for d/c from a mobility standpoint.  Pt's wife [name]) present during session and educated her on safe technique and how she can assist pt.  He will benefit from continued skilled PT services to increase functional independence and safety.   Follow Up Recommendations  Outpatient PT (when cleared by MD)     Equipment Recommendations  None recommended by PT    Recommendations for Other Services       Precautions / Restrictions Precautions Precautions: Fall Required Braces or Orthoses: Knee Immobilizer - Left Knee Immobilizer - Left: On when out of bed or walking Restrictions Weight Bearing Restrictions: Yes LLE Weight Bearing: Partial weight bearing LLE Partial Weight Bearing Percentage or Pounds:  (Not specified; performed TDWB this session )    Mobility  Bed Mobility Overal bed mobility: Modified Independent             General bed mobility comments: Increased time. No physical assist or cues needed  Transfers Overall transfer level: Needs assistance Equipment used: Crutches Transfers: Sit to/from Stand Sit to Stand: Min guard         General transfer comment: Min guard as pt has LOB upon standing quickly from bed and has to sit back to bed to regain balance.  Cues to slow down and cues for safe echnique.  Pt performs second sit>stand quickly as well.  Ambulation/Gait Ambulation/Gait assistance: Min guard Ambulation Distance (Feet): 100 Feet Assistive device: Crutches Gait  Pattern/deviations:  (2 point gait) Gait velocity: decreased Gait velocity interpretation: Below normal speed for age/gender General Gait Details: Pt demonstrates Lt NWB rather than TDWB as instructed.  PWB % still not specified in order and educated pt on TDWB until [facility] specified.  Pt continues to have crutches widely abducted despite cues.     Stairs Stairs: Yes Stairs assistance: Min assist Stair Management: No rails;Step to pattern;Forwards;With crutches Number of Stairs: 2 General stair comments: Upon initial attempt pt has LOB posteriorly when hopping up to 1st step and requires min assist to stabilize.  Educated pt on proper technique as he c/o the Lt knee immobilizer making it more difficult.  After cues to lean forward pt is able to safely navigate stairs.  Wheelchair Mobility    Modified Rankin (Stroke Patients Only)       Balance     Sitting balance-Leahy Scale: Good       Standing balance-Leahy Scale: Poor                      Cognition Arousal/Alertness: Awake/alert Behavior During Therapy: Agitated;Impulsive Overall Cognitive Status: Within Functional Limits for tasks assessed                      Exercises General Exercises - Lower Extremity Ankle Circles/Pumps: AROM;Both;15 reps;Seated Heel Slides: AROM;Left;5 reps;Seated Straight Leg Raises: AROM;Left;5 reps;Seated    General Comments General comments (skin integrity, edema, etc.): At start of session asked pt to don KI and he became agitated and required assist as  he had it positioned too low.        Pertinent Vitals/Pain Pain Assessment: 0-10 Pain Score: 5  Pain Location: Lt LE Pain Descriptors / Indicators: Throbbing Pain Intervention(s): Limited activity within patient's tolerance;Monitored during session;Repositioned;Premedicated before session    Home Living Family/patient expects to be discharged to:: Private residence Living Arrangements: Spouse/significant  other;Children                  Prior Function            PT Goals (current goals can now be found in the care plan section) Acute Rehab PT Goals Patient Stated Goal: none stated PT Goal Formulation: With patient Time For Goal Achievement: 10/07/14 Potential to Achieve Goals: Good Progress towards PT goals: Progressing toward goals    Frequency  Min 5X/week    PT Plan Current plan remains appropriate    Co-evaluation             End of Session Equipment Utilized During Treatment: Left knee immobilizer Activity Tolerance: Patient tolerated treatment well;Treatment limited secondary to agitation Patient left: with call bell/phone within reach;with family/visitor present;in chair     Time: 1610-9604 PT Time Calculation (min) (ACUTE ONLY): 19 min  Charges:  $Gait Training: 8-22 mins                    G Codes:      Michail Jewels PT, DPT [phone] Pager: [phone] 09/24/2014, 10:33 AM

## 2014-09-24 NOTE — Progress Notes (Signed)
Subjective: Doing ok.  C/o some leg pain.    Objective: Vital signs in last 24 hours: Temp:  [97.6 F (36.4 C)-98.6 F (37 C)] 97.8 F (36.6 C) (09/01 0709) Pulse Rate:  [84-110] 84 (09/01 0709) Resp:  [14-20] 16 (09/01 0709) BP: (122-157)/(77-91) 122/77 mmHg (09/01 0709) SpO2:  [96 %-100 %] 99 % (09/01 0709) Weight:  [89.699 kg (197 lb 12 oz)] 89.699 kg (197 lb 12 oz) (08/31 1044)  Intake/Output from previous day: 08/31 0701 - 09/01 0700 In: 650 [I.V.:550; IV Piggyback:100] Out: 400 [Urine:350; Blood:50] Intake/Output this shift: Total I/O In: -  Out: 500 [Urine:500]  No results for input(s): HGB in the last 72 hours. No results for input(s): WBC, RBC, HCT, PLT in the last 72 hours. No results for input(s): NA, K, CL, CO2, BUN, CREATININE, GLUCOSE, CALCIUM in the last 72 hours. No results for input(s): LABPT, INR in the last 72 hours.  Exam:  Alert and oriented.  Previous external fix pin site dressings changed. No bleeding.  Calf nontender NVI.   Assessment/Plan: Up with PT.  RN to do pin tract care.  Anticipate d/c home tomorrow.    OWENS,JAMES M 09/24/2014, 10:23 AM

## 2014-09-24 NOTE — Evaluation (Signed)
Occupational Therapy Evaluation Patient Details Name: [name] MRN: [id number] DOB: [birth year] Today's Date: 09/24/2014    History of Present Illness 54 y.o. male s/p removal of external fixator LLE tibia/fibula, and insertion of left tibial intramedullary nail.   Clinical Impression   Patient presenting with decreased I in self care, decreased balance, and decreased safety awareness. Patient reports being Mod I  PTA. Patient currently functioning at supervision - min A overall. Patient will benefit from acute OT to increase overall independence in the areas of ADLs, functional mobility,safety, and pt education in order to safely discharge home. Pt upset over having to wear KI during session but was able to don and doff independently while sitting in bed this session.     Follow Up Recommendations  No OT follow up    Equipment Recommendations  Tub/shower bench;Other (comment) (LB self care AE)    Recommendations for Other Services  (none)     Precautions / Restrictions Precautions Precautions: Fall Required Braces or Orthoses: Knee Immobilizer - Left Knee Immobilizer - Left: On when out of bed or walking Restrictions Weight Bearing Restrictions: Yes LLE Weight Bearing: Partial weight bearing      Mobility Bed Mobility Overal bed mobility: Modified Independent     General bed mobility comments: Increased time. No physical assist or cues needed  Transfers Overall transfer level: Needs assistance Equipment used: Crutches Transfers: Sit to/from Stand Sit to Stand: Supervision       Balance Overall balance assessment: Needs assistance Sitting-balance support: No upper extremity supported;Feet supported Sitting balance-Leahy Scale: Good     Standing balance support: During functional activity;Bilateral upper extremity supported Standing balance-Leahy Scale: Fair Standing balance comment: standing with crutches and washing hands at sink with close supervision.         ADL Overall ADL's : Needs assistance/impaired      General ADL Comments: Pt is overall close supervision for tasks except pt will require AE in order to increase A with LB self care secondary to L knee in KI when OOB. Pt able to don and doff KI independently this session but is agitated that he must wear it. Pt performing ambulation with close supervision into  bathroom for toilet transfer onto standard height toilet with close supervision. Pt returning to bed with  increased time for sit >supine.                Pertinent Vitals/Pain Pain Assessment: 0-10 Pain Score: 4  Pain Location: L LE Pain Descriptors / Indicators: Aching;Sore Pain Intervention(s): Monitored during session;Repositioned     Hand Dominance Right   Extremity/Trunk Assessment Upper Extremity Assessment Upper Extremity Assessment: Overall WFL for tasks assessed   Lower Extremity Assessment Lower Extremity Assessment: Defer to PT evaluation       Communication Communication Communication: No difficulties   Cognition Arousal/Alertness: Awake/alert Behavior During Therapy: WFL for tasks assessed/performed Overall Cognitive Status: Within Functional Limits for tasks assessed                   Home Living Family/patient expects to be discharged to:: Private residence Living Arrangements: Spouse/significant other;Children Available Help at Discharge: Family;Available PRN/intermittently Type of Home: House Home Access: Stairs to enter [employer] of Steps: 4 Entrance Stairs-Rails: Can reach both Home Layout: Two level;Able to live on main level with bedroom/bathroom     Bathroom Shower/Tub: Tub/shower unit;Curtain   Bathroom Toilet: Handicapped height     Home Equipment: Crutches;Other (comment) (3 in 1)  Prior Functioning/Environment Level of Independence: Independent with assistive device(s)        Comments: Using crutches, avoiding stairs in house    OT  Diagnosis: Acute pain;Generalized weakness   OT Problem List: Impaired balance (sitting and/or standing);Decreased safety awareness;Pain;Decreased activity tolerance   OT Treatment/Interventions: Self-care/ADL training;Balance training;Therapeutic activities;Energy conservation;DME and/or AE instruction;Patient/family education;Therapeutic exercise    OT Goals(Current goals can be found in the care plan section) Acute Rehab OT Goals Patient Stated Goal: to go home OT Goal Formulation: With patient Time For Goal Achievement: 10/08/14 Potential to Achieve Goals: Good ADL Goals Pt Will Perform Grooming: with modified independence;standing Pt Will Perform Upper Body Bathing: with modified independence;sitting Pt Will Perform Lower Body Bathing: with modified independence;sit to/from stand;with adaptive equipment Pt Will Perform Upper Body Dressing: with modified independence Pt Will Perform Lower Body Dressing: with modified independence;with adaptive equipment;sit to/from stand Pt Will Transfer to Toilet: with modified independence;regular height toilet Pt Will Perform Toileting - Clothing Manipulation and hygiene: with modified independence;sit to/from stand Pt Will Perform Tub/Shower Transfer: Tub transfer;tub bench;ambulating;with modified independence  OT Frequency: Min 2X/week   Barriers to D/C:    none known          End of Session Equipment Utilized During Treatment: Other (comment) (crutches)  Activity Tolerance: Patient tolerated treatment well Patient left: in bed;with call bell/phone within reach;with family/visitor present (wife present)   Time: 1343-1400 OT Time Calculation (min): 17 min Charges:  OT General Charges $OT Visit: 1 Procedure OT Evaluation $Initial OT Evaluation Tier I: 1 Procedure  Lowella Grip, MS, OTR/L 09/24/2014, 2:46 PM

## 2014-09-25 NOTE — Progress Notes (Signed)
Occupational Therapy Treatment Patient Details Name: [name] MRN: [id number] DOB: 1961/01/04 Today's Date: 09/25/2014    History of present illness 54 y.o. male s/p removal of external fixator LLE tibia/fibula, and insertion of left tibial intramedullary nail.   OT comments  Pt. Making gains with OT and is currently S level of A for all ADLS completed today.  Declines need for A/E secondary to family assist. Also declines need for tub/transfer practice.  Eager for d/c home today if possible.  Has several questions regarding KI for the MD, RN aware   Follow Up Recommendations  No OT follow up    Equipment Recommendations       Recommendations for Other Services      Precautions / Restrictions Precautions Precautions: Fall Required Braces or Orthoses: Knee Immobilizer - Left Knee Immobilizer - Left: On when out of bed or walking Restrictions LLE Weight Bearing: Partial weight bearing       Mobility Bed Mobility Overal bed mobility: Modified Independent                Transfers Overall transfer level: Needs assistance Equipment used: Crutches Transfers: Sit to/from [employer] Transfers Sit to Stand: Supervision Stand pivot transfers: Supervision            Balance                                   ADL Overall ADL's : Needs assistance/impaired     Grooming: Wash/dry hands;Standing;Supervision/safety Grooming Details (indicate cue type and reason): simulated during transfer in b.room       Lower Body Bathing Details (indicate cue type and reason): declines need for review, states wife and dtr. available to assist as needed       Lower Body Dressing Details (indicate cue type and reason): declines need for review, states wife and dtr. available to assist as needed Toilet Transfer: Supervision/safety;Ambulation;Regular Toilet   Toileting- Water quality scientist and Hygiene: Supervision/safety;Sit to/from Educational psychologist Details (indicate cue type and reason): declined need for review "ive been doing this for a while now, i have a set up that i do" Functional mobility during ADLs: Supervision/safety General ADL Comments: S for all performed ADLS, declined need for tub/tansfer practice, eager for d/c home does not like wearing the KI wants md clarification and review to why and if he still needs it      Vision                     Perception     Praxis      Cognition   Behavior During Therapy: [facility] for tasks assessed/performed Overall Cognitive Status: Within Functional Limits for tasks assessed                       Extremity/Trunk Assessment               Exercises     Shoulder Instructions       General Comments      Pertinent Vitals/ Pain       Pain Assessment: No/denies pain  Home Living                                          Prior Functioning/Environment  Frequency Min 2X/week     Progress Toward Goals  OT Goals(current goals can now be found in the care plan section)  Progress towards OT goals: Goals met/education completed, patient discharged from [place] Discharge plan remains appropriate    Co-evaluation                 End of Session     Activity Tolerance Patient tolerated treatment well   Patient Left in bed;with call bell/phone within reach   Nurse Communication Mobility status;Other (comment) (pt. wants to talk with md about need for KI and how he feels it interferes with increasing wb tolerance)        Time: 3435-6861 OT Time Calculation (min): 41 min  Charges: OT General Charges $OT Visit: 1 Procedure OT Treatments $Self Care/Home Management : 38-52 mins  Janice Coffin, COTA/L 09/25/2014, 9:12 AM

## 2014-09-25 NOTE — Discharge Instructions (Signed)
Weight bearing as tolerated. Do not need knee immobilizer.  Resume pin tract care as before. OK to shower .  See dr. Ophelia Charter in one week. Take aspirin one a day for 4 wks. Call for fever , chills , drainage.

## 2014-09-25 NOTE — Progress Notes (Signed)
Subjective: 2 Days Post-Op Procedure(s) (LRB): REMOVAL EXTERNAL FIXATION LEFT TIBIA/FIBULA (Left) INTRAMEDULLARY (IM) NAIL TIBIAL LEFT (Left) Patient reports pain as mild.    Objective: Vital signs in last 24 hours: Temp:  [97.8 F (36.6 C)-98.2 F (36.8 C)] 97.8 F (36.6 C) (09/02 0403) Pulse Rate:  [74-88] 77 (09/02 0403) Resp:  [18] 18 (09/02 0403) BP: (129-144)/(76-80) 131/76 mmHg (09/02 0403) SpO2:  [97 %-98 %] 98 % (09/02 0403)  Intake/Output from previous day: 09/01 0701 - 09/02 0700 In: 840 [P.O.:840] Out: 1300 [Urine:1300] Intake/Output this shift: Total I/O In: 240 [P.O.:240] Out: 400 [Urine:400]  No results for input(s): HGB in the last 72 hours. No results for input(s): WBC, RBC, HCT, PLT in the last 72 hours. No results for input(s): NA, K, CL, CO2, BUN, CREATININE, GLUCOSE, CALCIUM in the last 72 hours. No results for input(s): LABPT, INR in the last 72 hours.  Neurologically intact  Assessment/Plan: 2 Days Post-Op Procedure(s) (LRB): REMOVAL EXTERNAL FIXATION LEFT TIBIA/FIBULA (Left) INTRAMEDULLARY (IM) NAIL TIBIAL LEFT (Left) Up with therapy discharge home.   Jeane Cashatt C 09/25/2014, 11:33 AM

## 2014-09-25 NOTE — Plan of Care (Signed)
Problem: Acute Rehab OT Goals (only OT should resolve) Goal: Pt. Will Perform Lower Body Dressing Outcome: Not Applicable Date Met:  18/09/70 Pt. Reports family to assist. Goal: Pt. Will Perform Tub/Shower Transfer Outcome: Not Met (add Reason) Pt. Declined need for review

## 2014-09-25 NOTE — Progress Notes (Signed)
PER Charge Nurse report to this RN - d/c teaching complete, AVS printout and Rx's given to patient. Left via w/c with volunteer and family member to d/c - took personal belongings.

## 2014-09-25 NOTE — Progress Notes (Signed)
Physical Therapy Treatment Patient Details Name: [name] MRN: [id number] DOB: 1960-10-10 Today's Date: 09/25/2014    History of Present Illness 54 y.o. male s/p removal of external fixator LLE tibia/fibula, and insertion of left tibial intramedullary nail.    PT Comments    Met with patient for therapy session prior to d/c home. Patient approved for discontinued KI and WBAT per MD. Patient performed stair negotiation and ambulation with crutches without difficulty this session. Encouraged patient to start taking weight through LLE as per WBAT. Patient comfortable with mobility. Feel patient is safe for d/c home.   Follow Up Recommendations  Outpatient PT (when cleared by MD)     Equipment Recommendations  None recommended by PT    Recommendations for Other Services       Precautions / Restrictions Precautions Precautions: Fall Restrictions Weight Bearing Restrictions: Yes LLE Weight Bearing: Weight bearing as tolerated    Mobility  Bed Mobility Overal bed mobility: Modified Independent                Transfers Overall transfer level: Modified independent Equipment used: Crutches             General transfer comment: Vcs for attempts to place weight through LE, patient remains slef imposed NWBing  Ambulation/Gait Ambulation/Gait assistance: Modified independent (Device/Increase time) Ambulation Distance (Feet): 120 Feet Assistive device: Crutches Gait Pattern/deviations: Step-to pattern (remains self imposed NWBing despite VCs to take weight)     General Gait Details: VCs for controlled speed and to initiate WBing, patient remains self limiting in this regard. Improved stability noted with flexed knee compared to previous session'   Stairs Stairs: Yes Stairs assistance: Supervision Stair Management: Step to pattern;Forwards;With crutches Number of Stairs: 5 General stair comments: VCs for technique to begin [place], educated patient on ways to  initiate [place] with stair negotiation and one rail.  Wheelchair Mobility    Modified Rankin (Stroke Patients Only)       Balance     Sitting balance-Leahy Scale: Good       Standing balance-Leahy Scale: Fair Standing balance comment: VCs for use of LLE to take on weight in static standing, patient able to demonstrate for brief period                     Cognition Arousal/Alertness: Awake/alert Behavior During Therapy: WFL for tasks assessed/performed Overall Cognitive Status: Within Functional Limits for tasks assessed                      Exercises      General Comments        Pertinent Vitals/Pain Pain Assessment: 0-10 Pain Score: 5  Pain Location: LLE Pain Descriptors / Indicators: Aching Pain Intervention(s): Monitored during session;Limited activity within patient's tolerance;Repositioned    Home Living                      Prior Function            PT Goals (current goals can now be found in the care plan section) Acute Rehab PT Goals Patient Stated Goal: to go home PT Goal Formulation: With patient Time For Goal Achievement: 10/07/14 Potential to Achieve Goals: Good Progress towards PT goals: Progressing toward goals    Frequency  Min 5X/week    PT Plan Current plan remains appropriate    Co-evaluation             End of Session  Activity Tolerance: Patient tolerated treatment well Patient left: in bed;with call bell/phone within reach;with family/visitor present     Time: 9539-6728 PT Time Calculation (min) (ACUTE ONLY): 10 min  Charges:  $Self Care/Home Management: 8-22                    G CodesDuncan Dull 09-28-14, 3:07 PM Alben Deeds, [place] DPT  [phone]

## 2014-10-14 NOTE — Discharge Summary (Signed)
Patient ID: [name] MRN: [id number] DOB/AGE: [birth year] 54 y.o.  Admit date: 09/23/2014 Discharge date: 10/14/2014  Admission Diagnoses:  Active Problems:   Tibia fracture   Discharge Diagnoses:  Active Problems:   Tibia fracture  status post Procedure(s): REMOVAL EXTERNAL FIXATION LEFT TIBIA/FIBULA INTRAMEDULLARY (IM) NAIL TIBIAL LEFT  Past Medical History  Diagnosis Date  . Tibia/fibula fracture     left    Surgeries: Procedure(s): REMOVAL EXTERNAL FIXATION LEFT TIBIA/FIBULA INTRAMEDULLARY (IM) NAIL TIBIAL LEFT on 09/23/2014   Consultants:    Discharged Condition: Improved  Hospital Course: [name] is an 54 y.o. male who was admitted 09/23/2014 for operative treatment of tibia and fibula fractures.  Patient failed conservative treatments (please see the history and physical for the specifics) and had severe unremitting pain that affects sleep, daily activities and work/hobbies. After pre-op clearance, the patient was taken to the operating room on 09/23/2014 and underwent  Procedure(s): REMOVAL EXTERNAL FIXATION LEFT TIBIA/FIBULA INTRAMEDULLARY (IM) NAIL TIBIAL LEFT.    Patient was given perioperative antibiotics:  Anti-infectives    Start     Dose/Rate Route Frequency Ordered Stop   09/23/14 2030  ceFAZolin (ANCEF) IVPB 1 g/50 mL premix     1 g 100 mL/hr over 30 Minutes Intravenous Every 8 hours 09/23/14 1558 09/24/14 1222   09/23/14 1200  ceFAZolin (ANCEF) IVPB 2 g/50 mL premix     2 g 100 mL/hr over 30 Minutes Intravenous To [facility] 09/22/14 1257 09/23/14 1247       Patient was given sequential compression devices and early ambulation to prevent DVT.   Patient benefited maximally from hospital stay and there were no complications. At the time of discharge, the patient was urinating/moving their bowels without difficulty, tolerating a regular diet, pain is controlled with oral pain medications and they have been cleared by PT/OT.   Recent  vital signs: No data found.    Recent laboratory studies: No results for input(s): WBC, HGB, HCT, PLT, NA, K, CL, CO2, BUN, CREATININE, GLUCOSE, INR, CALCIUM in the last 72 hours.  Invalid input(s): PT, 2   Discharge Medications:     Medication List    TAKE these medications        aspirin EC 325 MG tablet  Take 325 mg by mouth daily.     doxycycline 100 MG capsule  Commonly known as:  VIBRAMYCIN  Take 100 mg by mouth 2 (two) times daily. For 14 days     methocarbamol 500 MG tablet  Commonly known as:  ROBAXIN  Take 1 tablet (500 mg total) by mouth every 6 (six) hours as needed for muscle spasms.     oxyCODONE-acetaminophen 7.5-325 MG per tablet  Commonly known as:  PERCOCET  Take 1 tablet by mouth every 4 (four) hours as needed for severe pain.        Diagnostic Studies: Dg Tibia/fibula Left  09/23/2014   CLINICAL DATA:  Hardware removal and IM nail placement in the left tibia.  EXAM: LEFT TIBIA AND FIBULA - 2 VIEW; DG C-ARM 61-120 MIN  COMPARISON:  08/02/2014  FINDINGS: Intraoperative images are performed, demonstrating proximal and distal aspects of intra medullary nail traversing the left tibia. Proximal and distal cortical screws secure the nail. Lateral plate is identified along the distal fibula.  FLUOROSCOPY TIME: 1 minutes 15 seconds  IMPRESSION: ORIF with intra medullary nail.   Electronically Signed   By: Norva Pavlov M.D.   On: 09/23/2014 14:29   Dg C-arm 1-60  Min  09/23/2014   CLINICAL DATA:  Hardware removal and IM nail placement in the left tibia.  EXAM: LEFT TIBIA AND FIBULA - 2 VIEW; DG C-ARM 61-120 MIN  COMPARISON:  08/02/2014  FINDINGS: Intraoperative images are performed, demonstrating proximal and distal aspects of intra medullary nail traversing the left tibia. Proximal and distal cortical screws secure the nail. Lateral plate is identified along the distal fibula.  FLUOROSCOPY TIME: 1 minutes 15 seconds  IMPRESSION: ORIF with intra medullary nail.    Electronically Signed   By: Norva Pavlov M.D.   On: 09/23/2014 14:29          Follow-up Information    Follow up with YATES,MARK C, MD In 1 week.   Specialty:  Orthopedic Surgery   Contact information:   [address] [place] [address] [phone]       Discharge Plan:  discharge to home  Disposition:     Signed: Naida Sleight 10/14/2014, 9:09 AM

## 2016-08-09 IMAGING — CR DG TIBIA/FIBULA 2V*L*
4 series · 4 of 4 positions shown · non-contrast
Comparison: Left tibia/fibula radiographs performed earlier today
at [DATE] p.m.

CLINICAL DATA: Preoperative radiographs for internal fixation of
left distal tibial fracture. Initial encounter.

EXAM:
LEFT TIBIA AND FIBULA - 2 VIEW

[tibia ap (1 of 2)]
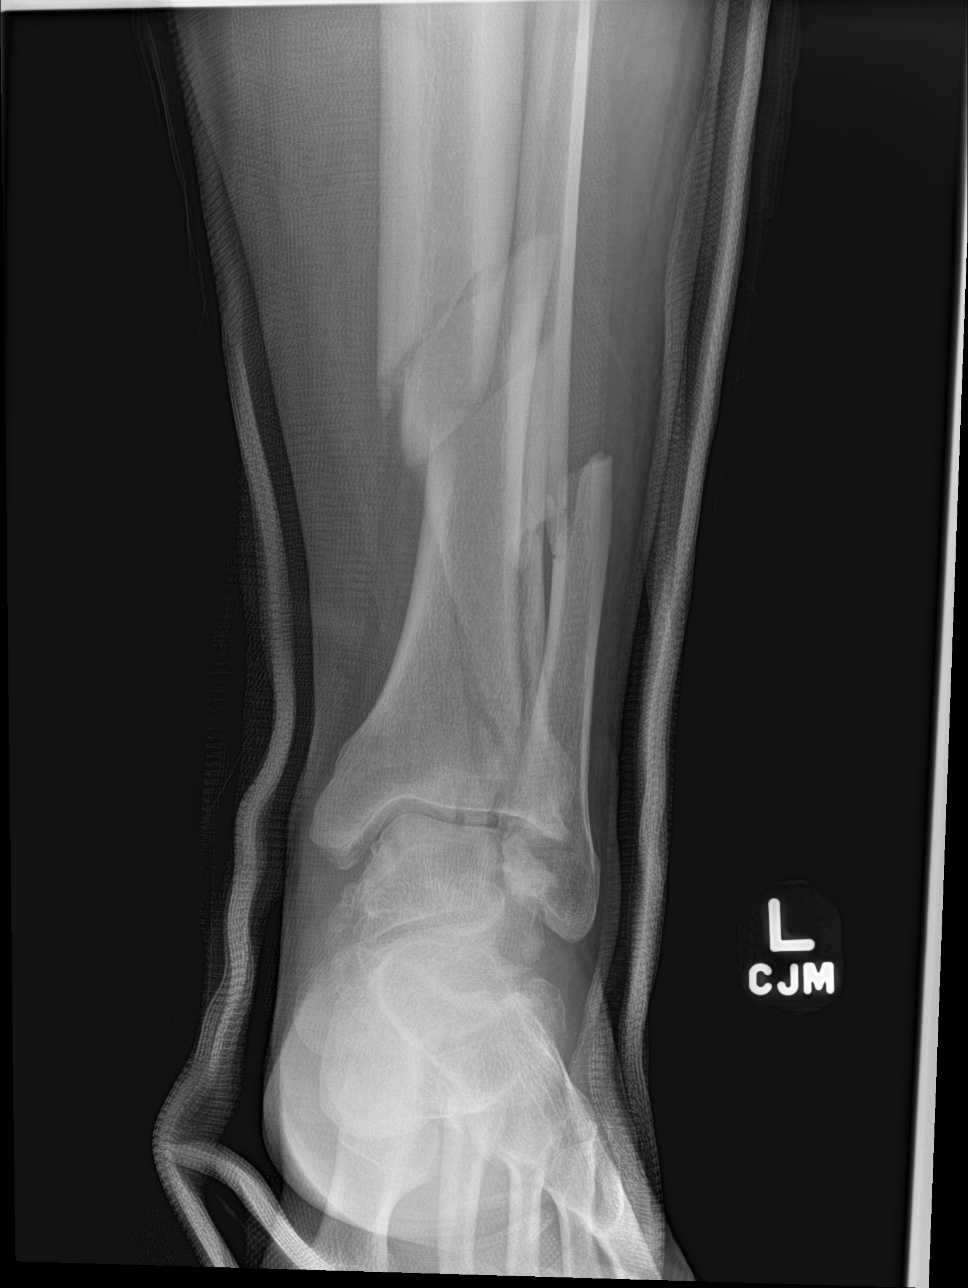

[tibia ap (2 of 2)]
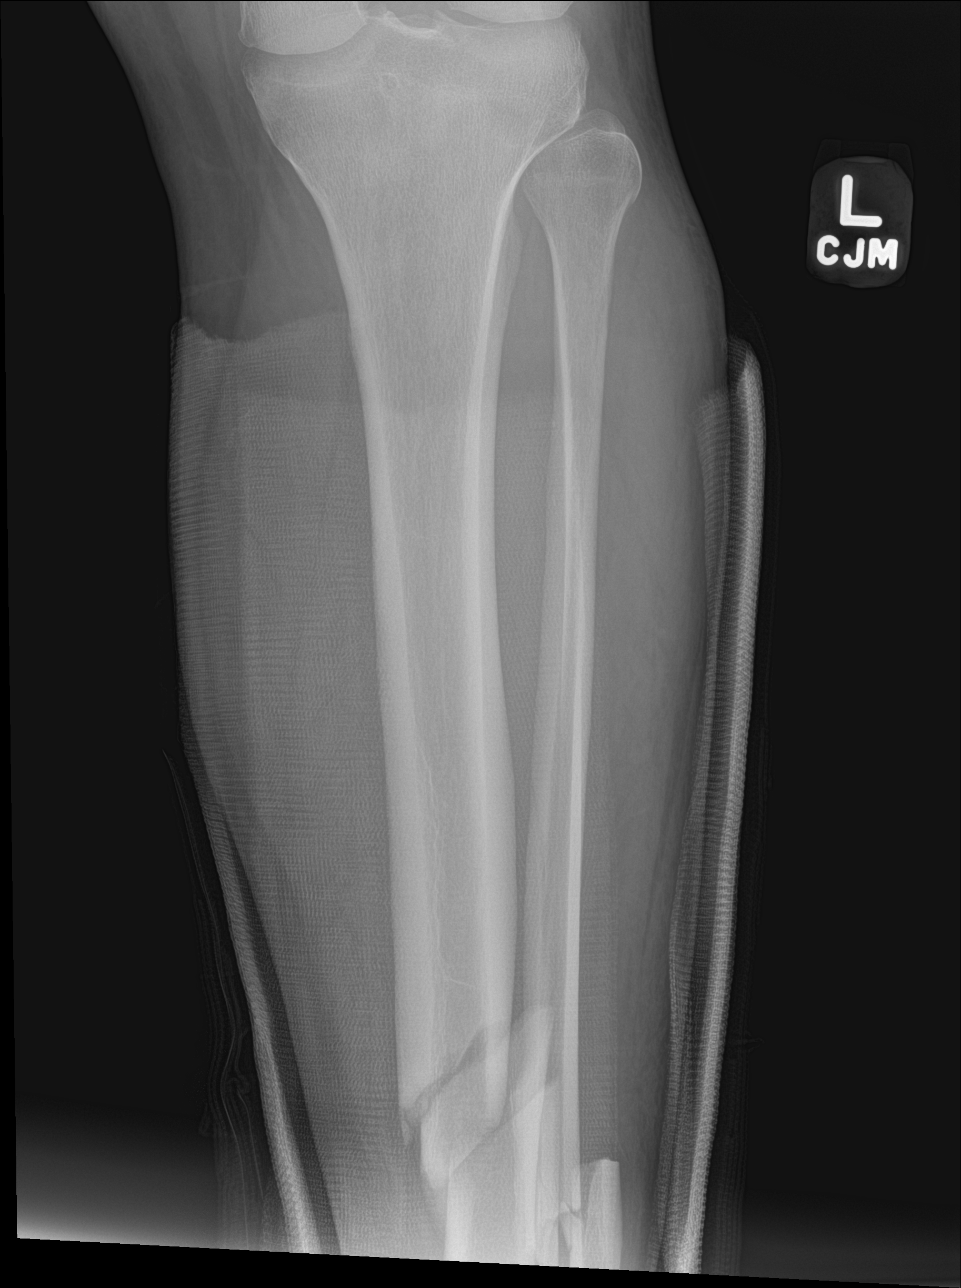

[tibia lat (1 of 2)]
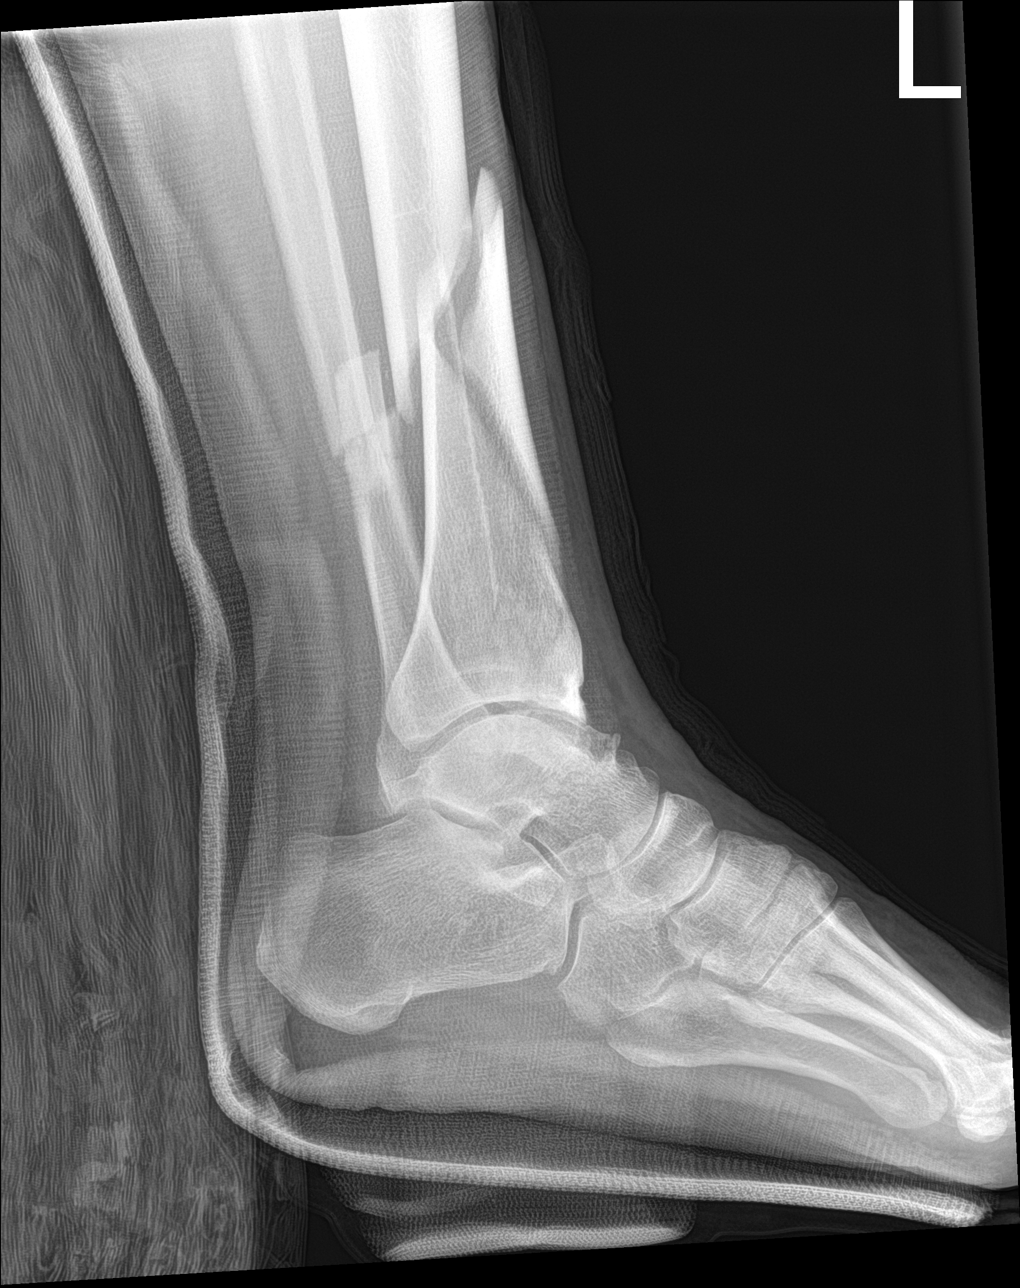

[tibia lat (2 of 2)]
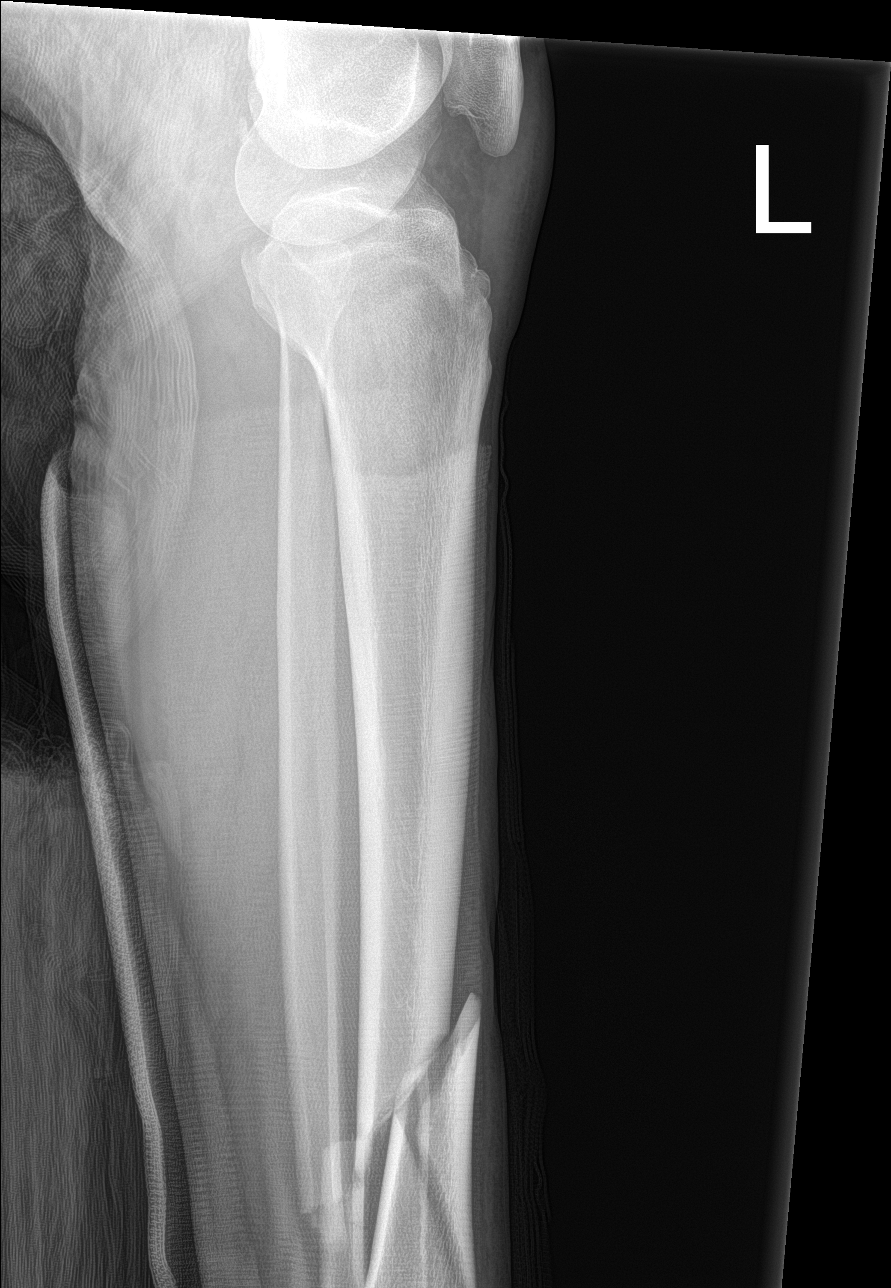

[4 of 4 positions shown; findings below may reference images not displayed]

FINDINGS: The comminuted fractures through the distal tibial and fibular
diaphyses are only minimally changed in appearance, with [DATE] shaft
width lateral and anterior displacement of the distal tibia, and 1
shaft width lateral and anterior displacement of the distal fibula.
Previously noted tilt has resolved.

There appears to be 3 mm of step-off at two fracture lines extending
to the tibial plafond. Chronic degenerative change is noted about
the medial aspect of the talus. The soft tissues are difficult to
fully assess given the overlying splint.
IMPRESSION: Comminuted fractures through the distal tibial and fibular diaphyses
are only minimally changed in appearance, with displacement as
described above. 3 mm of step-off noted at two fracture lines
extending to the tibial plafond.
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
## 2016-08-09 IMAGING — CR DG CHEST 2V
2 series · 2 of 2 positions shown · non-contrast
Comparison: Chest radiograph performed earlier today at [DATE] p.m.

CLINICAL DATA: Preoperative chest radiograph for internal fixation
of the tibia. Initial encounter.

EXAM:
CHEST  2 VIEW

[chest lat]
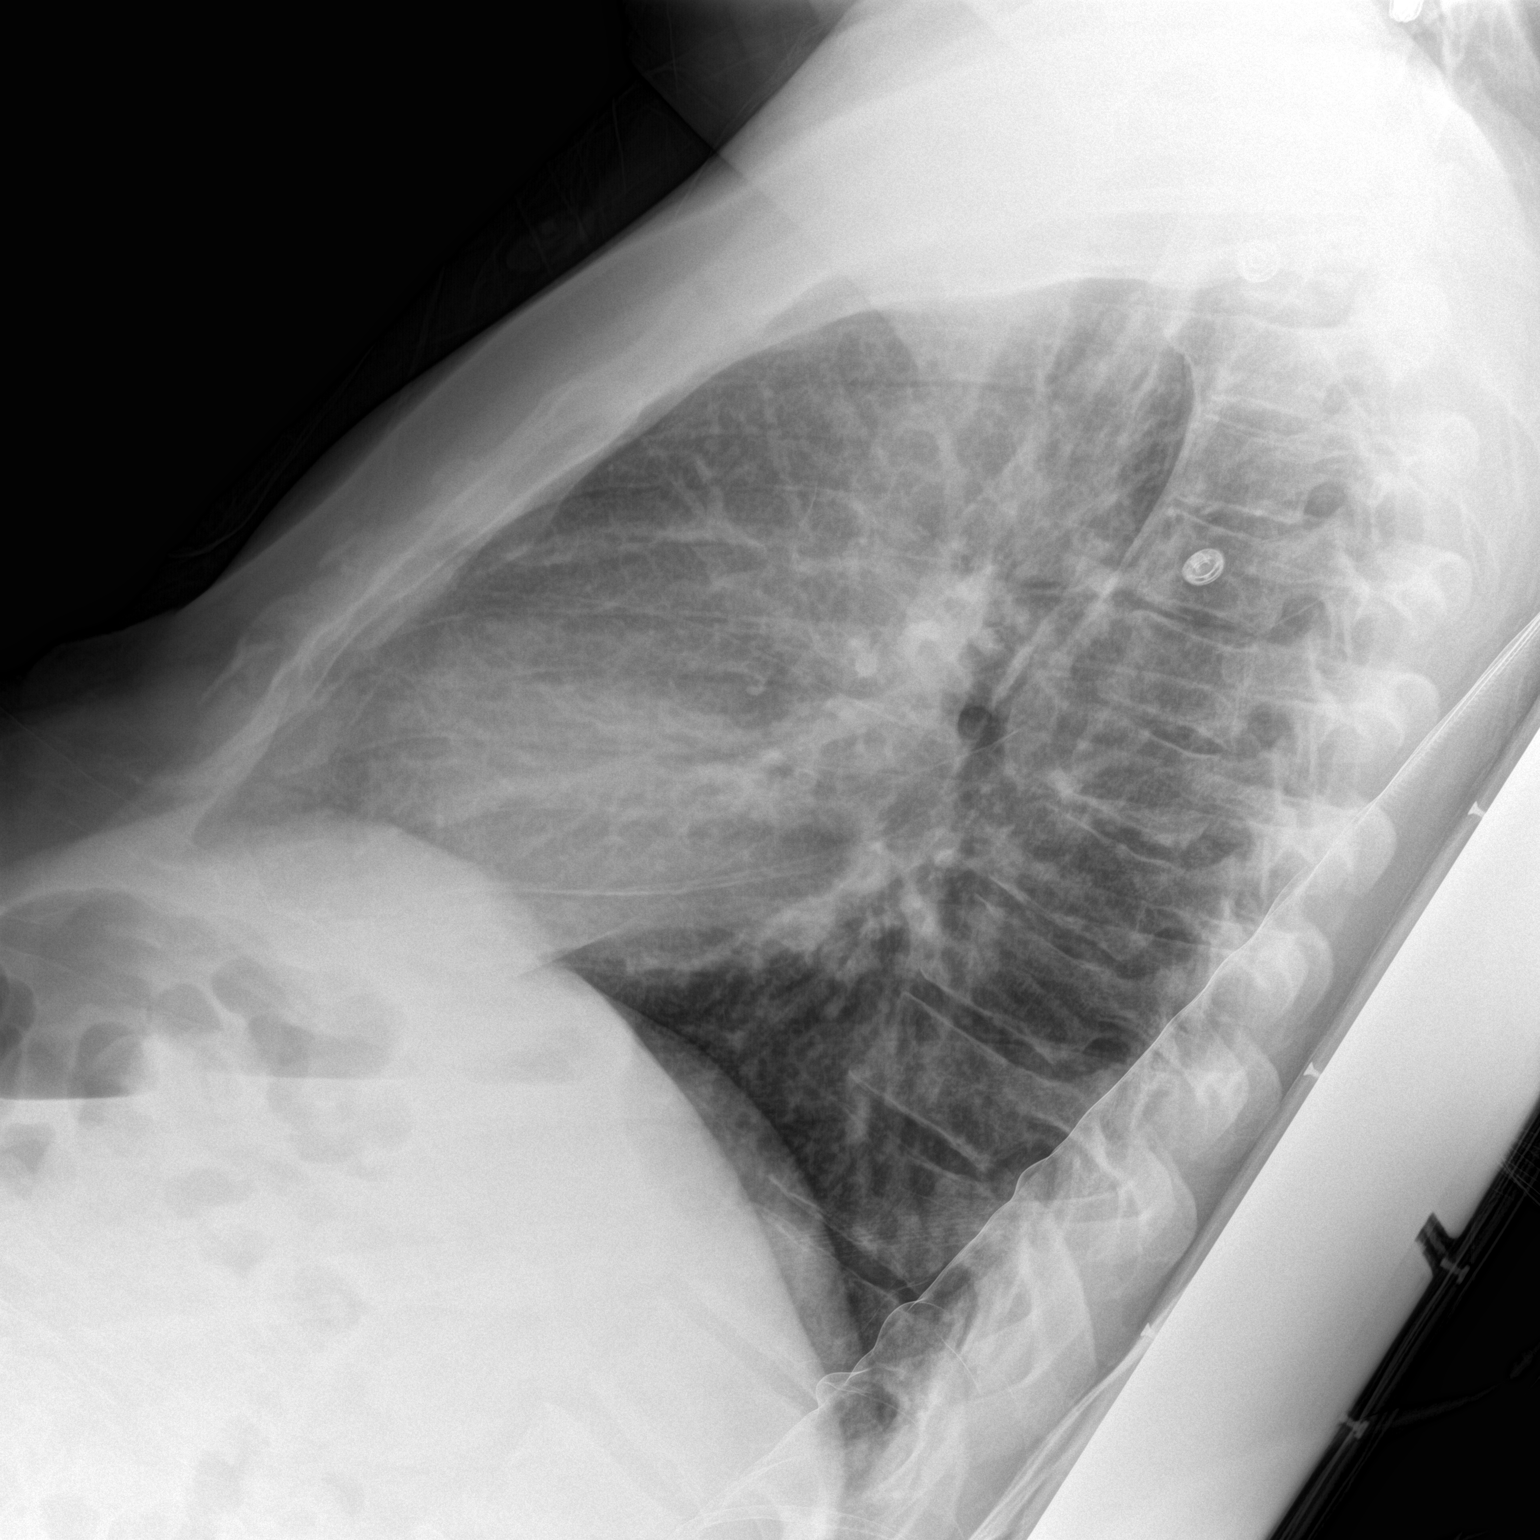

[chest ap]
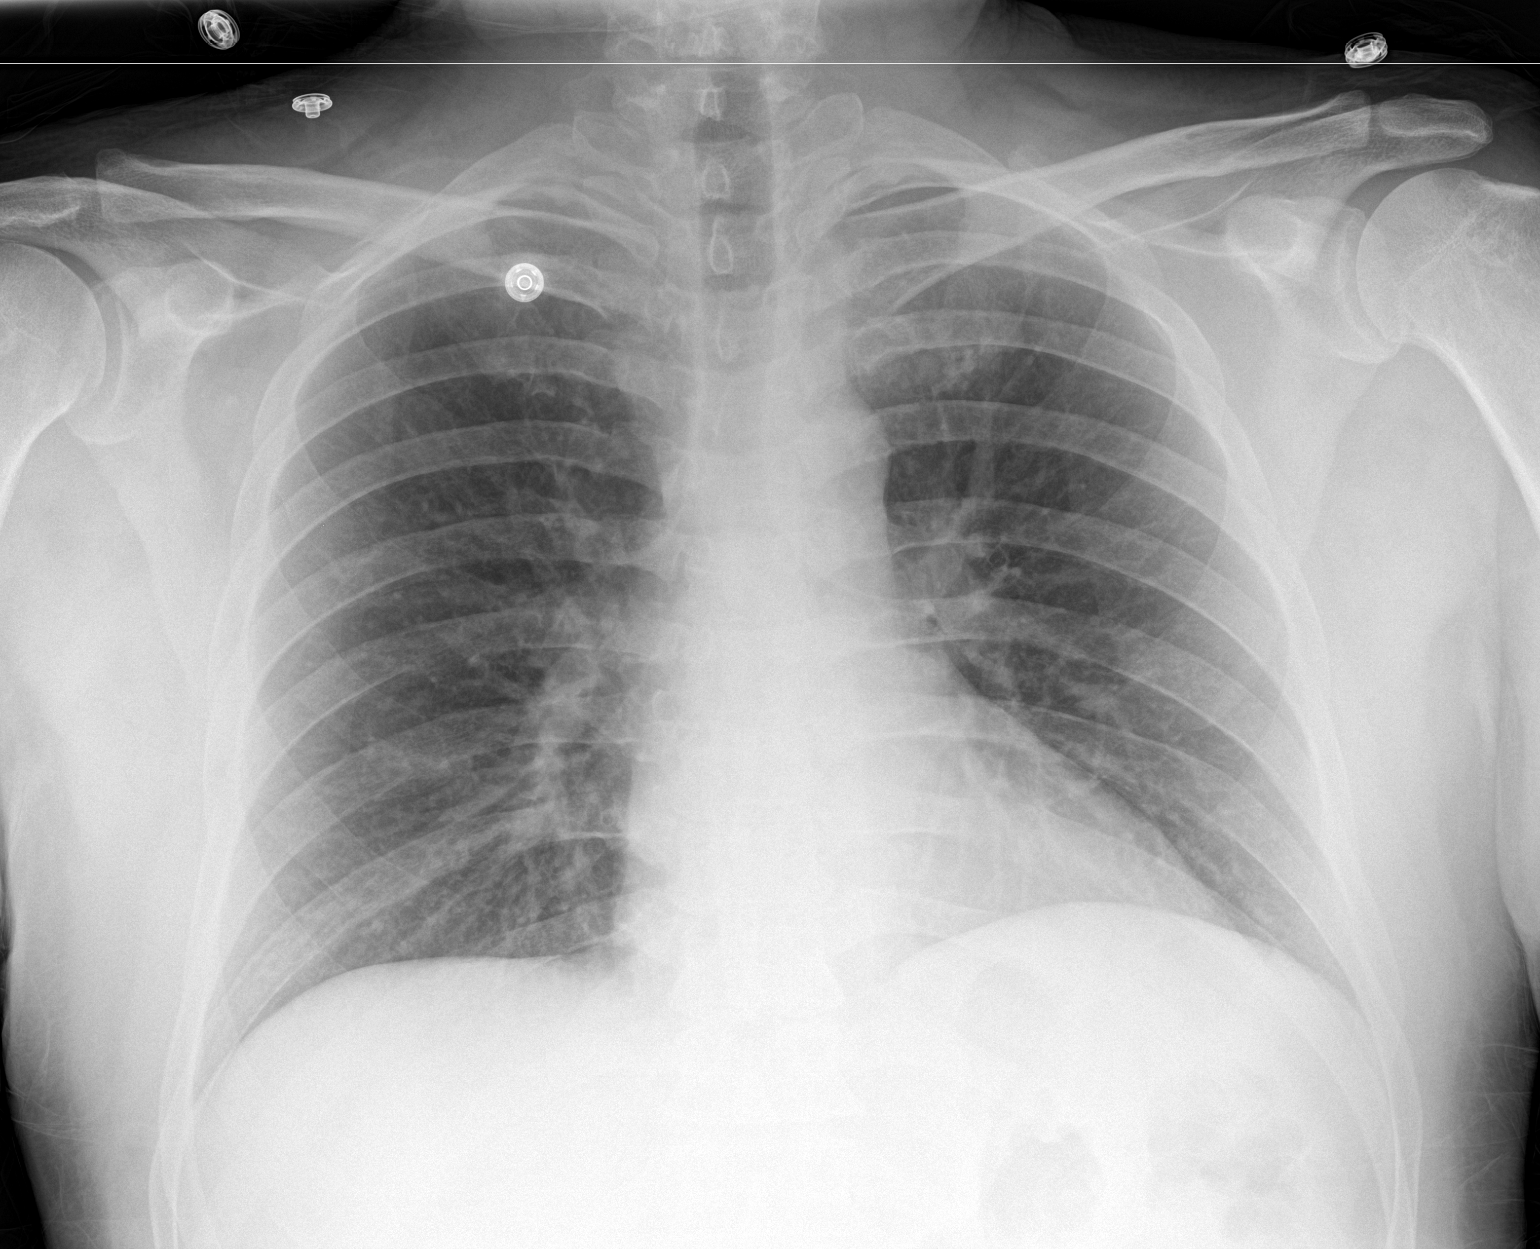

[2 of 2 positions shown; findings below may reference images not displayed]

FINDINGS: The lungs are well-aerated and clear. There is no evidence of focal
opacification, pleural effusion or pneumothorax.

The heart is normal in size; the mediastinal contour is within
normal limits. No acute osseous abnormalities are seen.
IMPRESSION: No acute cardiopulmonary process seen.

## 2017-06-25 DIAGNOSIS — Z0000 Encounter for general adult medical examination without abnormal findings: Secondary | ICD-10-CM | POA: Diagnosis not present

## 2017-06-29 DIAGNOSIS — Z0000 Encounter for general adult medical examination without abnormal findings: Secondary | ICD-10-CM | POA: Diagnosis not present

## 2017-06-29 DIAGNOSIS — E7800 Pure hypercholesterolemia, unspecified: Secondary | ICD-10-CM | POA: Diagnosis not present

## 2017-12-27 DIAGNOSIS — E7800 Pure hypercholesterolemia, unspecified: Secondary | ICD-10-CM | POA: Diagnosis not present

## 2017-12-27 DIAGNOSIS — Z125 Encounter for screening for malignant neoplasm of prostate: Secondary | ICD-10-CM | POA: Diagnosis not present

## 2017-12-27 DIAGNOSIS — Z0000 Encounter for general adult medical examination without abnormal findings: Secondary | ICD-10-CM | POA: Diagnosis not present

## 2018-01-03 DIAGNOSIS — E7800 Pure hypercholesterolemia, unspecified: Secondary | ICD-10-CM | POA: Diagnosis not present

## 2018-01-03 DIAGNOSIS — Z0000 Encounter for general adult medical examination without abnormal findings: Secondary | ICD-10-CM | POA: Diagnosis not present

## 2018-01-07 ENCOUNTER — Ambulatory Visit (INDEPENDENT_AMBULATORY_CARE_PROVIDER_SITE_OTHER): Payer: Self-pay

## 2018-01-07 ENCOUNTER — Encounter (INDEPENDENT_AMBULATORY_CARE_PROVIDER_SITE_OTHER): Payer: Self-pay | Admitting: Orthopedic Surgery

## 2018-01-07 ENCOUNTER — Ambulatory Visit (INDEPENDENT_AMBULATORY_CARE_PROVIDER_SITE_OTHER): Payer: 59 | Admitting: Physician Assistant

## 2018-01-07 VITALS — Ht 69.0 in | Wt 197.8 lb

## 2018-01-07 DIAGNOSIS — M1712 Unilateral primary osteoarthritis, left knee: Secondary | ICD-10-CM | POA: Diagnosis not present

## 2018-01-07 DIAGNOSIS — M25562 Pain in left knee: Secondary | ICD-10-CM

## 2018-01-08 ENCOUNTER — Encounter (INDEPENDENT_AMBULATORY_CARE_PROVIDER_SITE_OTHER): Payer: Self-pay | Admitting: Physician Assistant
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
